# Patient Record
Sex: Female | Born: 1975
Health system: Southern US, Community
[De-identification: ages and names within clinical notes are randomized; demographics above are authoritative.]

## PROBLEM LIST (undated history)

## (undated) DIAGNOSIS — R51 Headache: Secondary | ICD-10-CM

## (undated) DIAGNOSIS — E039 Hypothyroidism, unspecified: Secondary | ICD-10-CM

## (undated) HISTORY — PX: BREAST BIOPSY: SHX20

## (undated) HISTORY — PX: POLYPECTOMY: SHX149

## (undated) HISTORY — PX: DILATION AND CURETTAGE OF UTERUS: SHX78

---

## 2000-09-10 ENCOUNTER — Emergency Department (HOSPITAL_COMMUNITY): Admission: EM | Admit: 2000-09-10 | Discharge: 2000-09-10 | Payer: Self-pay | Admitting: Emergency Medicine

## 2001-02-04 ENCOUNTER — Other Ambulatory Visit: Admission: RE | Admit: 2001-02-04 | Discharge: 2001-02-04 | Payer: Self-pay | Admitting: *Deleted

## 2002-07-28 ENCOUNTER — Other Ambulatory Visit: Admission: RE | Admit: 2002-07-28 | Discharge: 2002-07-28 | Payer: Self-pay | Admitting: *Deleted

## 2003-10-18 ENCOUNTER — Other Ambulatory Visit: Admission: RE | Admit: 2003-10-18 | Discharge: 2003-10-18 | Payer: Self-pay | Admitting: Gynecology

## 2004-02-22 ENCOUNTER — Ambulatory Visit (HOSPITAL_BASED_OUTPATIENT_CLINIC_OR_DEPARTMENT_OTHER): Admission: RE | Admit: 2004-02-22 | Discharge: 2004-02-22 | Payer: Self-pay | Admitting: Gynecology

## 2004-02-22 ENCOUNTER — Ambulatory Visit (HOSPITAL_COMMUNITY): Admission: RE | Admit: 2004-02-22 | Discharge: 2004-02-22 | Payer: Self-pay | Admitting: Gynecology

## 2004-02-22 ENCOUNTER — Encounter (INDEPENDENT_AMBULATORY_CARE_PROVIDER_SITE_OTHER): Payer: Self-pay | Admitting: Specialist

## 2004-11-26 ENCOUNTER — Ambulatory Visit (HOSPITAL_COMMUNITY): Admission: RE | Admit: 2004-11-26 | Discharge: 2004-11-26 | Payer: Self-pay | Admitting: Family Medicine

## 2004-12-16 ENCOUNTER — Other Ambulatory Visit: Admission: RE | Admit: 2004-12-16 | Discharge: 2004-12-16 | Payer: Self-pay | Admitting: Gynecology

## 2005-07-15 ENCOUNTER — Encounter: Admission: RE | Admit: 2005-07-15 | Discharge: 2005-07-15 | Payer: Self-pay | Admitting: Surgery

## 2005-07-15 ENCOUNTER — Encounter (INDEPENDENT_AMBULATORY_CARE_PROVIDER_SITE_OTHER): Payer: Self-pay | Admitting: Specialist

## 2005-12-23 ENCOUNTER — Other Ambulatory Visit: Admission: RE | Admit: 2005-12-23 | Discharge: 2005-12-23 | Payer: Self-pay | Admitting: Gynecology

## 2006-10-29 ENCOUNTER — Ambulatory Visit: Payer: Self-pay | Admitting: Cardiovascular Disease

## 2006-11-11 ENCOUNTER — Encounter: Payer: Self-pay | Admitting: Cardiology

## 2006-11-11 ENCOUNTER — Ambulatory Visit: Payer: Self-pay

## 2007-01-12 ENCOUNTER — Other Ambulatory Visit: Admission: RE | Admit: 2007-01-12 | Discharge: 2007-01-12 | Payer: Self-pay | Admitting: Gynecology

## 2007-09-02 ENCOUNTER — Ambulatory Visit: Payer: Self-pay | Admitting: Gastroenterology

## 2007-11-05 DIAGNOSIS — E039 Hypothyroidism, unspecified: Secondary | ICD-10-CM | POA: Insufficient documentation

## 2007-11-05 DIAGNOSIS — I499 Cardiac arrhythmia, unspecified: Secondary | ICD-10-CM | POA: Insufficient documentation

## 2007-11-05 DIAGNOSIS — R197 Diarrhea, unspecified: Secondary | ICD-10-CM | POA: Insufficient documentation

## 2007-11-05 DIAGNOSIS — E739 Lactose intolerance, unspecified: Secondary | ICD-10-CM | POA: Insufficient documentation

## 2008-01-16 ENCOUNTER — Other Ambulatory Visit: Admission: RE | Admit: 2008-01-16 | Discharge: 2008-01-16 | Payer: Self-pay | Admitting: Gynecology

## 2009-01-25 ENCOUNTER — Ambulatory Visit: Payer: Self-pay | Admitting: Women's Health

## 2009-01-25 ENCOUNTER — Other Ambulatory Visit: Admission: RE | Admit: 2009-01-25 | Discharge: 2009-01-25 | Payer: Self-pay | Admitting: Gynecology

## 2009-01-25 ENCOUNTER — Encounter: Payer: Self-pay | Admitting: Women's Health

## 2010-02-05 ENCOUNTER — Ambulatory Visit: Payer: Self-pay | Admitting: Women's Health

## 2010-02-05 ENCOUNTER — Other Ambulatory Visit: Admission: RE | Admit: 2010-02-05 | Discharge: 2010-02-05 | Payer: Self-pay | Admitting: Gynecology

## 2010-04-11 ENCOUNTER — Ambulatory Visit: Payer: Self-pay | Admitting: Women's Health

## 2010-05-15 ENCOUNTER — Ambulatory Visit: Payer: Self-pay | Admitting: Women's Health

## 2010-05-20 ENCOUNTER — Ambulatory Visit: Payer: Self-pay | Admitting: Women's Health

## 2010-05-28 ENCOUNTER — Ambulatory Visit: Payer: Self-pay | Admitting: Women's Health

## 2010-06-09 ENCOUNTER — Ambulatory Visit: Payer: Self-pay | Admitting: Women's Health

## 2010-06-19 ENCOUNTER — Ambulatory Visit: Payer: Self-pay | Admitting: Women's Health

## 2010-06-20 ENCOUNTER — Ambulatory Visit (HOSPITAL_BASED_OUTPATIENT_CLINIC_OR_DEPARTMENT_OTHER): Admission: RE | Admit: 2010-06-20 | Discharge: 2010-06-20 | Payer: Self-pay | Admitting: Gynecology

## 2010-06-20 ENCOUNTER — Ambulatory Visit: Payer: Self-pay | Admitting: Gynecology

## 2010-07-01 ENCOUNTER — Ambulatory Visit: Payer: Self-pay | Admitting: Gynecology

## 2010-07-09 ENCOUNTER — Ambulatory Visit: Payer: Self-pay | Admitting: Gynecology

## 2010-11-04 ENCOUNTER — Ambulatory Visit
Admission: RE | Admit: 2010-11-04 | Discharge: 2010-11-04 | Payer: Self-pay | Source: Home / Self Care | Attending: Gynecology | Admitting: Gynecology

## 2010-12-25 LAB — POCT HEMOGLOBIN-HEMACUE: Hemoglobin: 15.4 g/dL — ABNORMAL HIGH (ref 12.0–15.0)

## 2011-01-02 ENCOUNTER — Ambulatory Visit (INDEPENDENT_AMBULATORY_CARE_PROVIDER_SITE_OTHER): Payer: 59 | Admitting: Gynecology

## 2011-01-02 ENCOUNTER — Other Ambulatory Visit: Payer: 59

## 2011-01-02 DIAGNOSIS — O9989 Other specified diseases and conditions complicating pregnancy, childbirth and the puerperium: Secondary | ICD-10-CM

## 2011-01-02 DIAGNOSIS — N912 Amenorrhea, unspecified: Secondary | ICD-10-CM

## 2011-01-09 ENCOUNTER — Ambulatory Visit (INDEPENDENT_AMBULATORY_CARE_PROVIDER_SITE_OTHER): Payer: 59 | Admitting: Gynecology

## 2011-01-09 ENCOUNTER — Other Ambulatory Visit: Payer: 59

## 2011-01-09 DIAGNOSIS — O26849 Uterine size-date discrepancy, unspecified trimester: Secondary | ICD-10-CM

## 2011-01-09 DIAGNOSIS — N912 Amenorrhea, unspecified: Secondary | ICD-10-CM

## 2011-01-19 ENCOUNTER — Other Ambulatory Visit: Payer: 59

## 2011-01-19 ENCOUNTER — Ambulatory Visit (INDEPENDENT_AMBULATORY_CARE_PROVIDER_SITE_OTHER): Payer: 59 | Admitting: Gynecology

## 2011-01-19 DIAGNOSIS — O9989 Other specified diseases and conditions complicating pregnancy, childbirth and the puerperium: Secondary | ICD-10-CM

## 2011-01-19 DIAGNOSIS — N912 Amenorrhea, unspecified: Secondary | ICD-10-CM

## 2011-02-13 LAB — ABO/RH: RH Type: POSITIVE

## 2011-02-13 LAB — ANTIBODY SCREEN: Antibody Screen: NEGATIVE

## 2011-02-13 LAB — RPR: RPR: NONREACTIVE

## 2011-02-13 LAB — GC/CHLAMYDIA PROBE AMP, GENITAL
Chlamydia: NEGATIVE
Gonorrhea: NEGATIVE

## 2011-02-13 LAB — RUBELLA ANTIBODY, IGM: Rubella: IMMUNE

## 2011-02-13 LAB — HIV ANTIBODY (ROUTINE TESTING W REFLEX): HIV: NONREACTIVE

## 2011-02-13 LAB — HEPATITIS B SURFACE ANTIGEN: Hepatitis B Surface Ag: NEGATIVE

## 2011-02-23 ENCOUNTER — Other Ambulatory Visit (HOSPITAL_COMMUNITY): Payer: Self-pay | Admitting: Obstetrics and Gynecology

## 2011-02-23 DIAGNOSIS — Z3682 Encounter for antenatal screening for nuchal translucency: Secondary | ICD-10-CM

## 2011-02-24 ENCOUNTER — Other Ambulatory Visit (HOSPITAL_COMMUNITY): Payer: 59

## 2011-02-24 NOTE — Letter (Signed)
September 02, 2007    Holley Bouche, M.D.  [address]  [place], [place] [address]   RE:  [name], [name]  MRN:  [id number]  /  DOB:  [birth year]   Dear Dr. Tiburcio Pea:   Upon your kind referral, I had the pleasure of evaluating your patient  and I am pleased to offer my findings.  I saw Sharon Lloyd in the office  today.  Enclosed is a copy of my progress note that details my findings  and recommendations.   Thank you for the opportunity to participate in your patient's care.    Sincerely,      Barbette Hair. Arlyce Dice, MD,FACG  Electronically Signed    RDK/MedQ  DD: 09/02/2007  DT: 09/02/2007  Job #: [id number]

## 2011-02-24 NOTE — Letter (Signed)
September 02, 2007    Jac Canavan   RE:  MARIA, COIN  MRN:  [id number]  /  DOB:  [birth year]   Dear Georga Hacking:   It is my pleasure to have treated you recently as a new patient in my  office.  I appreciate your confidence and the opportunity to participate  in your care.   Since I do have a busy inpatient endoscopy schedule and office schedule,  my office hours vary weekly.  I am, however, available for emergency  calls every day through my office.  If I cannot promptly meet an urgent  office appointment, another one of our gastroenterologists will be able  to assist you.   My well-trained staff are prepared to help you at all times.  For  emergencies after office hours, a physician from our gastroenterology  section is always available through my 24-hour answering service.   While you are under my care, I encourage discussion of your questions  and concerns, and I will be happy to return your calls as soon as I am  available.   Once again, I welcome you as a new patient and I look forward to a happy  and healthy relationship.    Sincerely,      Barbette Hair. Arlyce Dice, MD,FACG  Electronically Signed   RDK/MedQ  DD: 09/02/2007  DT: 09/02/2007  Job #: [id number]

## 2011-02-24 NOTE — Assessment & Plan Note (Signed)
[facility]                         GASTROENTEROLOGY OFFICE NOTE   Sharon Lloyd, Sharon Lloyd                           MRN:          [id number]  DATE:09/02/2007                            DOB:          04/08/1976    REASON FOR CONSULTATION:  Upset stomach.   Sharon Lloyd is a pleasant 35 year old white female referred through the  courtesy of Dr. Tiburcio Pea for evaluation.  For years she has had a very  sensitive stomach characterized by post prandial gas, bloating and  diarrhea.  This absolutely occurs when she takes any dairy products.  She now finds that red meat and various foods are causing problems as  well.  There is no history of melena or hematochezia, weight has been  stable.   PAST MEDICAL HISTORY:  Pertinent for hypothyroidism.   FAMILY HISTORY:  Noncontributory.   MEDICATIONS:  An oral contraceptive and Levoxyl.   SHE HAS NO ALLERGIES.   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   REVIEW OF SYSTEMS:  Negative.   PHYSICAL EXAMINATION:  She is a healthy-appearing female.  Pulse 78,  blood pressure 108/70, weight 110.  HEENT: EOMI.  PERRLA.  Sclerae are anicteric.  Conjunctivae are pink.  NECK:  Supple without thyromegaly, adenopathy or carotid bruits.  CHEST:  Clear to auscultation and percussion without adventitious  sounds.  CARDIAC:  Regular rhythm; normal S1 S2.  There are no murmurs, gallops  or rubs.  ABDOMEN:  Bowel sounds are normoactive.  Abdomen is soft, nontender and  nondistended.  There are no abdominal masses, tenderness, splenic  enlargement or hepatomegaly.  EXTREMITIES:  Full range of motion.  No cyanosis, clubbing or edema.  RECTAL:  Deferred.   IMPRESSION:  1. Lactose intolerance.  2. Probable irritable bowel syndrome.   RECOMMENDATION:  1. Fiber supplementation.  2. Trial of complete lactose-free diet for 4-5 days.  At that point      she was instructed to      aggressively use Lactaid.  3. I will  re-evaluate Sharon Lloyd in approximately one month.     Barbette Hair. Arlyce Dice, MD,FACG  Electronically Signed    RDK/MedQ  DD: 09/02/2007  DT: 09/02/2007  Job #: [id number]   cc:   Holley Bouche, M.D.

## 2011-02-27 NOTE — Op Note (Signed)
Sharon Lloyd, Sharon Lloyd                             ACCOUNT NO.:  [id number]   MEDICAL RECORD NO.:  [id number]                   PATIENT TYPE:  AMB   LOCATION:  [facility]                                 FACILITY:  [facility]   PHYSICIAN:  Ivor Costa. Farrel Gobble, M.D.              DATE OF BIRTH:  11/21/1975   DATE OF PROCEDURE:  02/22/2004  DATE OF DISCHARGE:                                 OPERATIVE REPORT   PREOPERATIVE DIAGNOSES:  1. Endometrial polyp.  2. Dysmenorrhea.   POSTOPERATIVE DIAGNOSES:  1. Endometrial polyp.  2. Dysmenorrhea.   PROCEDURE:  D&C hysteroscopy.   SURGEON:  Ivor Costa. Farrel Gobble, M.D.   ANESTHESIA:  MAC with paracervical block.   ESTIMATED BLOOD LOSS:  Minimal.   I&O DEFICIT:  3% sorbitol solution with 65 mL.   FINDINGS:  A retroverted uterus, there was a large polyp protruding into the  upper cervical canal.  The cavity had smooth contours, the ostia were  visualized and normal bilaterally. Pathology was endometrial curettings and  the endometrial polyp.   DESCRIPTION OF PROCEDURE:  The patient was taken to the operating room, IV  sedation was then used and placed in the dorsal lithotomy position, prepped  and draped in the usual sterile fashion after Laminaria removal.  Her cervix  was noted to be grossly dilated up to 31 [place] prior to the beginning of  the procedure.  Dilute Pitressin solution was injected at 3 and 9 o'clock  after which 0.5% Marcaine solution was also injected.  The cervix was  stabilized with a single tooth tenaculum, the operative hysteroscope was  then advanced through the cervix and immediately upon entering the cavity a  necrotic appearing endometrial polyp was visualized protruding into the  upper cervical os.  We were ultimately able to negotiate the hysteroscope  beyond this polyp and it appeared to be coming off the anterior wall but  visualization was certainly obscured. Because of the size of the polyp, we  ended up removing this.  We  attempted to remove this with polyp forceps but  was unsuccessful; however, we were able to achieve a polypectomy with a  sharp curettage of the anterior wall. The hysteroscope was then advanced  back through the cavity. The ostia were able to be visualized. There was  some irregularity of the anterior wall and this was excised with  electrocautery and hemostatic afterwards.  A small defect on the posterior  wall was similarly  excised. The hysteroscope was then slowly removed from the cavity,  hemostasis throughout was assured. The cervical canal was noted to be  unremarkable. The patient tolerated the procedure well. Sponge and sharp  needle counts were correct x2 and she was transferred to the PACU in stable  condition.  Ivor Costa. Farrel Gobble, M.D.    THL/MEDQ  D:  02/22/2004  T:  02/22/2004  Job:  [id number]

## 2011-02-27 NOTE — H&P (Signed)
NAMELORIANNE, MALBROUGH NO.:  [id number]   MEDICAL RECORD NO.:  [id number]                  PATIENT TYPE:  JWLS   LOCATION:  [facility]                                 FACILITY:  [facility]   PHYSICIAN:  Ivor Costa. Farrel Gobble, M.D.              DATE OF BIRTH:  08-09-1976   DATE OF ADMISSION:  02/22/2004  DATE OF DISCHARGE:                                HISTORY & PHYSICAL   CHIEF COMPLAINT:  Endometrial polyp.   HISTORY OF PRESENT ILLNESS:  Patient is a 35 year old G 0 who had presented  for an annual examination, complaining of increasingly irregular periods  with polymenorrhea.  The patient states that she could flow for a total of 7-  8 days and spot in between.  She was felt on her exam to have an abnormal-  feeling uterus, which prompted an ultrasound, which showed the presence of  an echogenic focus in the cavity.  A sonohisterogram done shortly thereafter  confirmed the presence of an anterior wall defect on the right that was 13 x  8 x 12 mm and an irregular-shaped endometrial cavity.  The patient also  reports a history of dysmenorrhea.  She is currently contracepting with  less.  She states that her bleeding is better, but her cramping is worse.  She presents now for a D&C hysteroscopy.   PAST OB/GYN HISTORY:  Significant for history of Chlamydia diagnosed in  January, 2005.  The patient has had a negative test of cure.  She is also  negative RPR, HIV, and hepatitis.  Her cycles are as mentioned above.   PAST MEDICAL HISTORY:  Negative.   PAST SURGICAL HISTORY:  The patient had a urethral dilation in 1980.   MEDICATIONS:  She is on Alesse.   ALLERGIES:  Negative.   SOCIAL HISTORY:  Alcohol socially.  Caffeine in excess of 3 times a week.   FAMILY HISTORY:  Noncontributory.   PHYSICAL EXAMINATION:  GENERAL:  She is well-appearing in no acute distress.  LUNGS:  Clear to auscultation.  HEART:  Regular rate.  ABDOMEN:  Soft and nontender.  PELVIC:  On  speculum exam, her cervix deviates towards the right.  On  bimanual exam, her uterus is markedly retroverted.  Curettes were flexed,  sounding 8.  The patient had Laminaria placed the day before surgery.  EXTREMITIES:  Negative.   ASSESSMENT:  Endometrial polyp.  As mentioned, the patient had Laminaria  placed the day before.  She will present in the afternoon for a dilatation  and curettage/hysteroscopy with a polypectomy.  All questions were  addressed.  She was given a prescription preoperatively for Darvocet-N 100.  Ivor Costa. Farrel Gobble, M.D.    THL/MEDQ  D:  02/21/2004  T:  02/21/2004  Job:  [id number]

## 2011-02-27 NOTE — Assessment & Plan Note (Signed)
[facility]                            CARDIOLOGY OFFICE NOTE   Sharon Lloyd, Sharon Lloyd                           MRN:          [id number]  DATE:10/29/2006                            DOB:          [birth year]    REFERRING PHYSICIAN:  Holley Bouche, M.D.   Sharon Lloyd is a 35 year old patient referred by Dr. Tiburcio Pea for palpitations.  The Wednesday before Christmas she had rapid irregular heart beats, they  were not sustained, they were irregular. She has no significant chest  pain, shortness of breath, PND or orthopnea with them. She has not had  any syncope, she has never had previous heart arrhythmias. The irregular  heart beats lasted for about 2 weeks. During this period of time, she  was a bit sleep deprived working multiple 12 hours shifts as a shift.  There was also some stress during the holiday season. They lasted for  about 2 weeks and have subsided.   EKGs from Dr. Tiburcio Pea' office were essentially normal with sinus rhythm  and only isolated PVCs.   The patient does have a history of hypothyroidism since a very young  age. She has been on Synthroid replacement. Apparently her T4 level has  been a little high and her TSH has been normal. She will workup with Dr.  Tiburcio Pea for this.   REVIEW OF SYSTEMS:  Otherwise remarkable for no significant chest pain,  no PND or orthopnea, no other signs of hyperthyroidism.   She has not had a previous cardiac workup before. She quit smoking and  only did it socially in college.   She drinks on rare occasion.   She has 2 caffeinated beverages a day.   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 She is otherwise fairly active.  She travels quite a bit and likes to ski. She has had previous polyps  removed from her uterus in 2006. Surgical history otherwise negative.   She has no significant allergies. She is on birth control pills and  Levoxyl 100 alternating with 112 mcg.   Family History:  remarkable for  hypothyroidism on mothers side   PHYSICAL EXAMINATION:  HEENT:  Normal. There is no thyromegaly, there is  no lymphadenopathy.  LUNGS:  Clear.  HEART:  There is an S1, S2 with normal heart sounds.  ABDOMEN:  Benign.  EXTREMITIES:  Lower extremities intact pulses, no edema.  Neuro: non-focal  Skin:  warm and dry   EKG is essentially normal. She has prominent voltage but I think this is  because she is thin.   IMPRESSION:  Benign palpitations in the setting of stress and sleep  deprivation, occasional benign PVCs caught on an EKG. Symptoms have  resolved. At this time, I encouraged her to followup with Dr. Tiburcio Pea to  titrate her Synthroid replacement possibly to a slightly lower level.   She will try to avoid caffeine and will do a 2-D echocardiogram to rule  out any occult structural heart disease. She will call me if she has  recurrent symptoms in regards to getting hooked up to a  [employer]  monitor. As long as her echo is normal and her symptoms have not  returned, we will see her on a p.r.n. basis.     Sharon Lloyd. Eden Emms, MD, [facility]  Electronically Signed    PCN/MedQ  DD: 10/29/2006  DT: 10/29/2006  Job #: [id number]   cc:   Holley Bouche, M.D.

## 2011-03-02 ENCOUNTER — Encounter (HOSPITAL_COMMUNITY): Payer: Self-pay

## 2011-03-02 ENCOUNTER — Ambulatory Visit (HOSPITAL_COMMUNITY)
Admission: RE | Admit: 2011-03-02 | Discharge: 2011-03-02 | Disposition: A | Discharge status: Home / Self Care | Payer: 59 | Source: Ambulatory Visit | Attending: Obstetrics and Gynecology | Admitting: Obstetrics and Gynecology

## 2011-03-02 DIAGNOSIS — Z3682 Encounter for antenatal screening for nuchal translucency: Secondary | ICD-10-CM

## 2011-03-02 DIAGNOSIS — O351XX0 Maternal care for (suspected) chromosomal abnormality in fetus, not applicable or unspecified: Secondary | ICD-10-CM | POA: Insufficient documentation

## 2011-03-02 DIAGNOSIS — Z3689 Encounter for other specified antenatal screening: Secondary | ICD-10-CM | POA: Insufficient documentation

## 2011-03-02 DIAGNOSIS — O3510X0 Maternal care for (suspected) chromosomal abnormality in fetus, unspecified, not applicable or unspecified: Secondary | ICD-10-CM | POA: Insufficient documentation

## 2011-08-22 ENCOUNTER — Encounter (HOSPITAL_COMMUNITY): Payer: Self-pay | Admitting: Pharmacy Technician

## 2011-08-25 ENCOUNTER — Encounter (HOSPITAL_COMMUNITY): Payer: Self-pay

## 2011-08-25 NOTE — Patient Instructions (Signed)
   Your procedure is scheduled ZO:XWRUEAV November 20th  Enter through the Main Entrance of [facility] at:11am Pick up the phone at the desk and dial [phone] and inform us of your arrival.  Please call this number if you have any problems the morning of surgery: [phone]  Remember: Do not eat food after midnight:Monday Do not drink clear liquids after:Tuesday 8:30am Take these medicines the morning of surgery with a SIP OF WATER:  Do not wear jewelry, make-up, or FINGER nail polish Do not wear lotions, powders, or perfumes.  You may not  wear deodorant. Do not shave 48 hours prior to surgery. Do not bring valuables to the hospital.  Leave suitcase in the car. After Surgery it may be brought to your room. For patients being admitted to the hospital, checkout time is 11:00am the day of discharge.    Remember to use your hibiclens as instructed.Please shower with 1/2 bottle the evening before your surgery and the other 1/2 bottle the morning of surgery.

## 2011-08-28 ENCOUNTER — Encounter (HOSPITAL_COMMUNITY)
Admission: RE | Admit: 2011-08-28 | Discharge: 2011-08-28 | Disposition: A | Discharge status: Home / Self Care | Payer: 59 | Source: Ambulatory Visit | Attending: Obstetrics and Gynecology | Admitting: Obstetrics and Gynecology

## 2011-08-28 ENCOUNTER — Encounter (HOSPITAL_COMMUNITY): Payer: Self-pay

## 2011-08-28 HISTORY — DX: Hypothyroidism, unspecified: E03.9

## 2011-08-28 HISTORY — DX: Headache: R51

## 2011-08-28 LAB — SURGICAL PCR SCREEN
MRSA, PCR: NEGATIVE
Staphylococcus aureus: NEGATIVE

## 2011-08-28 LAB — CBC
HCT: 36.7 % (ref 36.0–46.0)
Hemoglobin: 12.9 g/dL (ref 12.0–15.0)
MCH: 31.8 pg (ref 26.0–34.0)
MCHC: 35.1 g/dL (ref 30.0–36.0)
MCV: 90.4 fL (ref 78.0–100.0)
Platelets: 230 10*3/uL (ref 150–400)
RBC: 4.06 MIL/uL (ref 3.87–5.11)
RDW: 13.1 % (ref 11.5–15.5)
WBC: 11 10*3/uL — ABNORMAL HIGH (ref 4.0–10.5)

## 2011-08-28 LAB — RPR: RPR Ser Ql: NONREACTIVE

## 2011-08-31 NOTE — H&P (Signed)
Sharon Lloyd NO.:  [id number]  MEDICAL RECORD NO.:  [id number]  LOCATION:                                 FACILITY:  PHYSICIAN:  Malachi Pro. Ambrose Mantle, M.D. DATE OF BIRTH:  1976/08/09  DATE OF ADMISSION:  09/01/2011 DATE OF DISCHARGE:                             HISTORY & PHYSICAL   PRESENT ILLNESS:  This is a 35 year old white female, para 0-0-1-0, gravida 2, last menstrual period October 29, 2010, with an [facility] of September 08, 2011.  An ultrasound at 6 weeks and 6 days on January 19, 2011.  On an ultrasound in our office on February 05, 2011, showed a 9-week 4-day pregnancy with an [facility] of September 06, 2011.  Blood group and type is AB positive with a negative antibody.  Pap smear normal.  Rubella immune.  RPR nonreactive.  Urine culture negative.  Hepatitis B surface antigen negative.  HIV negative.  TSH was 2.18.  GC and Chlamydia negative.  One-hour Glucola was 70.  Group B strep was negative.  Repeat HIV and RPR were nonreactive.  The patient's prenatal course was relatively uncomplicated.  Her ultrasound on April 13, 2011, showed an average gestational age of [redacted] weeks and 6 days with an [facility] of September 01, 2011.  Anatomy was normal. On August 11, 2011, the patient had a group B strep culture done and Dr. Jackelyn Knife felt the baby was in breech presentation.  It has remained in breech presentation since that time.  She is scheduled for C-section now.  She was to have a C-section done by Dr. Ellyn Hack, but Dr. Ellyn Hack is out on maternity leave.  Dr. Jackelyn Knife was to do it after that, but he has had a family emergency and asked me to do the C-section in his absence.  The patient understands that and agrees to proceed.  PAST MEDICAL HISTORY:  No known allergies, although ZITHROMAX causes upset stomach.  MEDICATIONS:  Prenatal vitamins and Levoxyl oral tablet 112 mcg a day.  SURGICAL HISTORY:  Breast biopsy, cyst removed from her uterus, dilatation and curettage,  and wisdom teeth extraction.  MEDICAL HISTORY:  She did receive blood transfusion at birth, none since.  She has acquired hypothyroidism and is followed by Dr. Talmage Nap. She has migraines with aura.  She has had a spontaneous miscarriage.  FAMILY HISTORY:  Her father has high blood pressure, prostate cancer and her mother has thyroid disease.  OBSTETRIC HISTORY:  The patient had a spontaneous abortion on June 20, 2010.  She is married.  She does not have a formal exercise program. She denies alcohol, tobacco, and drugs.  PHYSICAL EXAMINATION:  GENERAL:  On admission, well-developed, well- nourished white female in no distress. VITAL SIGNS:  Blood pressure is 118/66, pulse is 72, weight is 141 pounds. HEAD, EYES, NOSE, AND THROAT:  Normal. LUNGS:  Clear to auscultation. HEART:  Normal size and sounds.  No murmurs. ABDOMEN:  Soft, nontender.  Fundal height is 37 cm by ultrasound.  The vertex is still in the patient's right upper quadrant.  The back comes around to the patient's left, so the baby is presenting LST.  Cervix is not  examined today, but on August 27, 2011, the cervix was 1 cm, 30%, and the breech was at a -3.  ADMITTING IMPRESSION:  Intrauterine pregnancy at 39 weeks with breech presentation.  The patient elects to go ahead with C-section.     Malachi Pro. Ambrose Mantle, M.D.     TFH/MEDQ  D:  08/31/2011  T:  08/31/2011  Job:  [id number]

## 2011-09-01 ENCOUNTER — Encounter (HOSPITAL_COMMUNITY): Payer: Self-pay | Admitting: Anesthesiology

## 2011-09-01 ENCOUNTER — Inpatient Hospital Stay (HOSPITAL_COMMUNITY): Payer: 59 | Admitting: Anesthesiology

## 2011-09-01 ENCOUNTER — Other Ambulatory Visit: Payer: Self-pay | Admitting: Obstetrics and Gynecology

## 2011-09-01 ENCOUNTER — Inpatient Hospital Stay (HOSPITAL_COMMUNITY)
Admission: AD | Admit: 2011-09-01 | Discharge: 2011-09-04 | DRG: 766 | Disposition: A | Discharge status: Home / Self Care | Payer: 59 | Source: Ambulatory Visit | Attending: Obstetrics and Gynecology | Admitting: Obstetrics and Gynecology

## 2011-09-01 ENCOUNTER — Encounter (HOSPITAL_COMMUNITY)
Admission: AD | Disposition: A | Discharge status: Home / Self Care | Payer: Self-pay | Source: Ambulatory Visit | Attending: Obstetrics and Gynecology

## 2011-09-01 ENCOUNTER — Encounter (HOSPITAL_COMMUNITY): Payer: Self-pay | Admitting: *Deleted

## 2011-09-01 DIAGNOSIS — O321XX0 Maternal care for breech presentation, not applicable or unspecified: Principal | ICD-10-CM | POA: Diagnosis present

## 2011-09-01 DIAGNOSIS — Z98891 History of uterine scar from previous surgery: Secondary | ICD-10-CM | POA: Diagnosis not present

## 2011-09-01 SURGERY — Surgical Case
Anesthesia: Regional | Site: Uterus | Wound class: Clean Contaminated

## 2011-09-01 MED ORDER — DIPHENHYDRAMINE HCL 50 MG/ML IJ SOLN: 12.5000 mg | INTRAMUSCULAR | Status: DC | PRN

## 2011-09-01 MED ORDER — SCOPOLAMINE 1 MG/3DAYS TD PT72
MEDICATED_PATCH | TRANSDERMAL | Status: AC
  Administered 2011-09-01: 1.5 mg via TRANSDERMAL
  Filled 2011-09-01: qty 1

## 2011-09-01 MED ORDER — MORPHINE SULFATE 0.5 MG/ML IJ SOLN
INTRAMUSCULAR | Status: AC
  Filled 2011-09-01: qty 10

## 2011-09-01 MED ORDER — SCOPOLAMINE 1 MG/3DAYS TD PT72
1.0000 | MEDICATED_PATCH | Freq: Once | TRANSDERMAL | Status: DC
  Administered 2011-09-01: 1.5 mg via TRANSDERMAL

## 2011-09-01 MED ORDER — MEPERIDINE HCL 25 MG/ML IJ SOLN: 6.2500 mg | INTRAMUSCULAR | Status: DC | PRN

## 2011-09-01 MED ORDER — DIBUCAINE 1 % RE OINT: 1.0000 "application " | TOPICAL_OINTMENT | RECTAL | Status: DC | PRN

## 2011-09-01 MED ORDER — KETOROLAC TROMETHAMINE 30 MG/ML IJ SOLN
30.0000 mg | Freq: Four times a day (QID) | INTRAMUSCULAR | Status: DC | PRN
  Administered 2011-09-01 (×2): 30 mg via INTRAVENOUS
  Filled 2011-09-01: qty 1

## 2011-09-01 MED ORDER — ACETAMINOPHEN 325 MG PO TABS: 325.0000 mg | ORAL_TABLET | ORAL | Status: DC | PRN

## 2011-09-01 MED ORDER — OXYTOCIN 10 UNIT/ML IJ SOLN
INTRAMUSCULAR | Status: AC
  Filled 2011-09-01: qty 2

## 2011-09-01 MED ORDER — ONDANSETRON HCL 4 MG PO TABS: 4.0000 mg | ORAL_TABLET | ORAL | Status: DC | PRN

## 2011-09-01 MED ORDER — FENTANYL CITRATE 0.05 MG/ML IJ SOLN
INTRAMUSCULAR | Status: AC
  Filled 2011-09-01: qty 2

## 2011-09-01 MED ORDER — LACTATED RINGERS IV SOLN
Freq: Once | INTRAVENOUS | Status: AC
  Administered 2011-09-01 (×2): via INTRAVENOUS
  Administered 2011-09-01: 1000 mL/h via INTRAVENOUS
  Administered 2011-09-01 (×2): via INTRAVENOUS

## 2011-09-01 MED ORDER — LANOLIN HYDROUS EX OINT: 1.0000 "application " | TOPICAL_OINTMENT | CUTANEOUS | Status: DC | PRN

## 2011-09-01 MED ORDER — WITCH HAZEL-GLYCERIN EX PADS: 1.0000 "application " | MEDICATED_PAD | CUTANEOUS | Status: DC | PRN

## 2011-09-01 MED ORDER — DIPHENHYDRAMINE HCL 25 MG PO CAPS: 25.0000 mg | ORAL_CAPSULE | ORAL | Status: DC | PRN

## 2011-09-01 MED ORDER — TETANUS-DIPHTH-ACELL PERTUSSIS 5-2.5-18.5 LF-MCG/0.5 IM SUSP: 0.5000 mL | Freq: Once | INTRAMUSCULAR | Status: DC

## 2011-09-01 MED ORDER — IBUPROFEN 600 MG PO TABS
600.0000 mg | ORAL_TABLET | Freq: Four times a day (QID) | ORAL | Status: DC
  Administered 2011-09-02 – 2011-09-04 (×10): 600 mg via ORAL
  Filled 2011-09-01 (×5): qty 1

## 2011-09-01 MED ORDER — PHENYLEPHRINE 40 MCG/ML (10ML) SYRINGE FOR IV PUSH (FOR BLOOD PRESSURE SUPPORT)
PREFILLED_SYRINGE | INTRAVENOUS | Status: AC
  Filled 2011-09-01: qty 5

## 2011-09-01 MED ORDER — SODIUM CHLORIDE 0.9 % IV SOLN
1.0000 ug/kg/h | INTRAVENOUS | Status: DC | PRN
  Filled 2011-09-01: qty 2.5

## 2011-09-01 MED ORDER — CEFAZOLIN SODIUM 1-5 GM-% IV SOLN
1.0000 g | INTRAVENOUS | Status: AC
  Administered 2011-09-01: 1 g via INTRAVENOUS

## 2011-09-01 MED ORDER — PROMETHAZINE HCL 25 MG/ML IJ SOLN: 6.2500 mg | INTRAMUSCULAR | Status: DC | PRN

## 2011-09-01 MED ORDER — NALBUPHINE HCL 10 MG/ML IJ SOLN
5.0000 mg | INTRAMUSCULAR | Status: DC | PRN
Start: 2011-09-01 — End: 2011-09-04
  Filled 2011-09-01: qty 1

## 2011-09-01 MED ORDER — EPHEDRINE SULFATE 50 MG/ML IJ SOLN
INTRAMUSCULAR | Status: DC | PRN
  Administered 2011-09-01 (×3): 5 mg via INTRAVENOUS

## 2011-09-01 MED ORDER — DIPHENHYDRAMINE HCL 25 MG PO CAPS: 25.0000 mg | ORAL_CAPSULE | Freq: Four times a day (QID) | ORAL | Status: DC | PRN

## 2011-09-01 MED ORDER — OXYTOCIN 20 UNITS IN LACTATED RINGERS INFUSION - SIMPLE
INTRAVENOUS | Status: DC | PRN
  Administered 2011-09-01 (×2): 20 [IU] via INTRAVENOUS

## 2011-09-01 MED ORDER — ACETAMINOPHEN 10 MG/ML IV SOLN
1000.0000 mg | Freq: Four times a day (QID) | INTRAVENOUS | Status: AC | PRN
  Filled 2011-09-01: qty 100

## 2011-09-01 MED ORDER — SODIUM CHLORIDE 0.9 % IJ SOLN: 3.0000 mL | INTRAMUSCULAR | Status: DC | PRN

## 2011-09-01 MED ORDER — SENNOSIDES-DOCUSATE SODIUM 8.6-50 MG PO TABS
2.0000 | ORAL_TABLET | Freq: Every day | ORAL | Status: DC
  Administered 2011-09-02: 1 via ORAL
  Administered 2011-09-03: 2 via ORAL

## 2011-09-01 MED ORDER — KETOROLAC TROMETHAMINE 30 MG/ML IJ SOLN: 30.0000 mg | Freq: Four times a day (QID) | INTRAMUSCULAR | Status: DC | PRN

## 2011-09-01 MED ORDER — IBUPROFEN 600 MG PO TABS
600.0000 mg | ORAL_TABLET | Freq: Four times a day (QID) | ORAL | Status: DC | PRN
  Filled 2011-09-01 (×4): qty 1

## 2011-09-01 MED ORDER — NALOXONE HCL 0.4 MG/ML IJ SOLN: 0.4000 mg | INTRAMUSCULAR | Status: DC | PRN

## 2011-09-01 MED ORDER — ONDANSETRON HCL 4 MG/2ML IJ SOLN
INTRAMUSCULAR | Status: DC | PRN
  Administered 2011-09-01: 4 mg via INTRAVENOUS

## 2011-09-01 MED ORDER — OXYTOCIN 20 UNITS IN LACTATED RINGERS INFUSION - SIMPLE: 125.0000 mL/h | INTRAVENOUS | Status: AC

## 2011-09-01 MED ORDER — FENTANYL CITRATE 0.05 MG/ML IJ SOLN
INTRAMUSCULAR | Status: DC | PRN
  Administered 2011-09-01: 25 ug via INTRAVENOUS

## 2011-09-01 MED ORDER — NALBUPHINE HCL 10 MG/ML IJ SOLN
5.0000 mg | INTRAMUSCULAR | Status: DC | PRN
Start: 2011-09-01 — End: 2011-09-04
  Administered 2011-09-01: 5 mg via INTRAVENOUS
  Filled 2011-09-01: qty 1

## 2011-09-01 MED ORDER — ONDANSETRON HCL 4 MG/2ML IJ SOLN: 4.0000 mg | Freq: Three times a day (TID) | INTRAMUSCULAR | Status: DC | PRN

## 2011-09-01 MED ORDER — DIPHENHYDRAMINE HCL 50 MG/ML IJ SOLN: 25.0000 mg | INTRAMUSCULAR | Status: DC | PRN

## 2011-09-01 MED ORDER — EPHEDRINE 5 MG/ML INJ
INTRAVENOUS | Status: AC
  Filled 2011-09-01: qty 10

## 2011-09-01 MED ORDER — LACTATED RINGERS IV SOLN: INTRAVENOUS | Status: DC

## 2011-09-01 MED ORDER — FENTANYL CITRATE 0.05 MG/ML IJ SOLN
INTRAMUSCULAR | Status: AC
  Administered 2011-09-01: 50 ug via INTRAVENOUS
  Filled 2011-09-01: qty 2

## 2011-09-01 MED ORDER — SCOPOLAMINE 1 MG/3DAYS TD PT72
1.0000 | MEDICATED_PATCH | Freq: Once | TRANSDERMAL | Status: DC
  Filled 2011-09-01: qty 1

## 2011-09-01 MED ORDER — OXYCODONE-ACETAMINOPHEN 5-325 MG PO TABS
1.0000 | ORAL_TABLET | ORAL | Status: DC | PRN
  Administered 2011-09-02 (×3): 1 via ORAL
  Filled 2011-09-01 (×3): qty 1

## 2011-09-01 MED ORDER — ONDANSETRON HCL 4 MG/2ML IJ SOLN
INTRAMUSCULAR | Status: AC
  Filled 2011-09-01: qty 2

## 2011-09-01 MED ORDER — KETOROLAC TROMETHAMINE 30 MG/ML IJ SOLN
INTRAMUSCULAR | Status: AC
  Administered 2011-09-01: 30 mg via INTRAVENOUS
  Filled 2011-09-01: qty 1

## 2011-09-01 MED ORDER — METOCLOPRAMIDE HCL 5 MG/ML IJ SOLN: 10.0000 mg | Freq: Three times a day (TID) | INTRAMUSCULAR | Status: DC | PRN

## 2011-09-01 MED ORDER — FENTANYL CITRATE 0.05 MG/ML IJ SOLN
25.0000 ug | INTRAMUSCULAR | Status: DC | PRN
  Administered 2011-09-01: 50 ug via INTRAVENOUS

## 2011-09-01 MED ORDER — MEASLES, MUMPS & RUBELLA VAC ~~LOC~~ INJ
0.5000 mL | INJECTION | Freq: Once | SUBCUTANEOUS | Status: DC
  Filled 2011-09-01: qty 0.5

## 2011-09-01 MED ORDER — MENTHOL 3 MG MT LOZG: 1.0000 | LOZENGE | OROMUCOSAL | Status: DC | PRN

## 2011-09-01 MED ORDER — SIMETHICONE 80 MG PO CHEW
80.0000 mg | CHEWABLE_TABLET | Freq: Three times a day (TID) | ORAL | Status: DC
  Administered 2011-09-02 – 2011-09-04 (×10): 80 mg via ORAL

## 2011-09-01 MED ORDER — SIMETHICONE 80 MG PO CHEW: 80.0000 mg | CHEWABLE_TABLET | ORAL | Status: DC | PRN

## 2011-09-01 MED ORDER — PHENYLEPHRINE HCL 10 MG/ML IJ SOLN
INTRAMUSCULAR | Status: DC | PRN
  Administered 2011-09-01 (×5): 40 ug via INTRAVENOUS

## 2011-09-01 MED ORDER — ONDANSETRON HCL 4 MG/2ML IJ SOLN: 4.0000 mg | INTRAMUSCULAR | Status: DC | PRN

## 2011-09-01 MED ORDER — ZOLPIDEM TARTRATE 5 MG PO TABS: 5.0000 mg | ORAL_TABLET | Freq: Every evening | ORAL | Status: DC | PRN

## 2011-09-01 SURGICAL SUPPLY — 32 items
CLOTH BEACON ORANGE TIMEOUT ST (SAFETY) ×2 IMPLANT
CONTAINER PREFILL 10% NBF 15ML (MISCELLANEOUS) IMPLANT
DRESSING TELFA 8X3 (GAUZE/BANDAGES/DRESSINGS) IMPLANT
DRSG VASELINE 3X18 (GAUZE/BANDAGES/DRESSINGS) ×2 IMPLANT
ELECT REM PT RETURN 9FT ADLT (ELECTROSURGICAL) ×2
ELECTRODE REM PT RTRN 9FT ADLT (ELECTROSURGICAL) ×1 IMPLANT
EXTRACTOR VACUUM KIWI (MISCELLANEOUS) IMPLANT
EXTRACTOR VACUUM M CUP 4 TUBE (SUCTIONS) IMPLANT
GAUZE SPONGE 4X4 12PLY STRL LF (GAUZE/BANDAGES/DRESSINGS) ×3 IMPLANT
GAUZE VASELINE 3X9 (GAUZE/BANDAGES/DRESSINGS) ×1 IMPLANT
GLOVE BIO SURGEON STRL SZ7.5 (GLOVE) ×4 IMPLANT
GOWN PREVENTION PLUS LG XLONG (DISPOSABLE) ×4 IMPLANT
GOWN PREVENTION PLUS XLARGE (GOWN DISPOSABLE) ×2 IMPLANT
KIT ABG SYR 3ML LUER SLIP (SYRINGE) IMPLANT
NDL HYPO 25X5/8 SAFETYGLIDE (NEEDLE) ×1 IMPLANT
NEEDLE HYPO 25X5/8 SAFETYGLIDE (NEEDLE) ×2 IMPLANT
NS IRRIG 1000ML POUR BTL (IV SOLUTION) ×2 IMPLANT
PACK C SECTION WH (CUSTOM PROCEDURE TRAY) ×2 IMPLANT
PAD ABD 7.5X8 STRL (GAUZE/BANDAGES/DRESSINGS) IMPLANT
RTRCTR C-SECT PINK 25CM LRG (MISCELLANEOUS) ×1 IMPLANT
SLEEVE SCD COMPRESS KNEE MED (MISCELLANEOUS) IMPLANT
STAPLER VISISTAT 35W (STAPLE) ×1 IMPLANT
SUT PLAIN 0 NONE (SUTURE) IMPLANT
SUT VIC AB 0 CT1 36 (SUTURE) ×16 IMPLANT
SUT VIC AB 3-0 CTX 36 (SUTURE) ×2 IMPLANT
SUT VIC AB 3-0 SH 27 (SUTURE)
SUT VIC AB 3-0 SH 27X BRD (SUTURE) IMPLANT
SUT VIC AB 4-0 KS 27 (SUTURE) IMPLANT
SUT VICRYL 0 TIES 12 18 (SUTURE) IMPLANT
TOWEL OR 17X24 6PK STRL BLUE (TOWEL DISPOSABLE) ×4 IMPLANT
TRAY FOLEY CATH 14FR (SET/KITS/TRAYS/PACK) ×2 IMPLANT
WATER STERILE IRR 1000ML POUR (IV SOLUTION) ×2 IMPLANT

## 2011-09-01 NOTE — Transfer of Care (Signed)
Immediate Anesthesia Transfer of Care Note  Patient: Sharon Lloyd  Procedure(s) Performed:  CESAREAN SECTION  Patient Location: PACU  Anesthesia Type: Spinal  Level of Consciousness: awake, alert  and oriented  Airway & Oxygen Therapy: Patient Spontanous Breathing  Post-op Assessment: Report given to PACU RN and Post -op Vital signs reviewed and stable  Post vital signs: Reviewed and stable  Complications: No apparent anesthesia complications

## 2011-09-01 NOTE — Progress Notes (Signed)
Patient ID: Sharon Lloyd, female   DOB: January 07, 1976, 35 y.o.   MRN: [id number] I examined this pt 08-31-11 and there have been no changes in her health since then. US shows breech presentation and position is LST

## 2011-09-01 NOTE — Anesthesia Preprocedure Evaluation (Addendum)
Anesthesia Evaluation  Patient identified by MRN, date of birth, ID band Patient awake    Reviewed: Allergy & Precautions, H&P , Patient's Chart, lab work & pertinent test results  Airway Mallampati: II TM Distance: >3 FB Neck ROM: full    Dental No notable dental hx.    Pulmonary neg pulmonary ROS,  clear to auscultation  Pulmonary exam normal       Cardiovascular neg cardio ROS regular Normal    Neuro/Psych Negative Neurological ROS  Negative Psych ROS   GI/Hepatic negative GI ROS, Neg liver ROS,   Endo/Other  Negative Endocrine ROS  Renal/GU negative Renal ROS     Musculoskeletal   Abdominal   Peds  Hematology negative hematology ROS (+)   Anesthesia Other Findings   Reproductive/Obstetrics (+) Pregnancy                           Anesthesia Physical Anesthesia Plan  ASA: II  Anesthesia Plan: Spinal   Post-op Pain Management:    Induction:   Airway Management Planned:   Additional Equipment:   Intra-op Plan:   Post-operative Plan:   Informed Consent: I have reviewed the patients History and Physical, chart, labs and discussed the procedure including the risks, benefits and alternatives for the proposed anesthesia with the patient or authorized representative who has indicated his/her understanding and acceptance.     Plan Discussed with:   Anesthesia Plan Comments:         Anesthesia Quick Evaluation

## 2011-09-01 NOTE — Anesthesia Postprocedure Evaluation (Signed)
Anesthesia Post Note  Patient: Education officer, community  Procedure(s) Performed:  CESAREAN SECTION  Anesthesia type: Spinal  Patient location: PACU  Post pain: Pain level controlled  Post assessment: Post-op Vital signs reviewed  Last Vitals:  Filed Vitals:   09/01/11 1415  BP: 113/69  Pulse: 71  Temp:   Resp: 24    Post vital signs: Reviewed  Level of consciousness: awake  Complications: No apparent anesthesia complications

## 2011-09-01 NOTE — Anesthesia Procedure Notes (Addendum)
Spinal  Patient location during procedure: OR Start time: 09/01/2011 12:38 PM Staffing Performed by: anesthesiologist  Preanesthetic Checklist Completed: patient identified, site marked, surgical consent, pre-op evaluation, timeout performed, IV checked, risks and benefits discussed and monitors and equipment checked Spinal Block Patient position: sitting Prep: site prepped and draped and DuraPrep Patient monitoring: heart rate, cardiac monitor, continuous pulse ox and blood pressure Approach: midline Location: L3-4 Injection technique: single-shot Needle Needle type: Sprotte  Needle gauge: 24 G Needle length: 9 cm Assessment Sensory level: T4 Additional Notes Clear free flow CSF on first attempt.

## 2011-09-01 NOTE — Op Note (Signed)
NAMEJACHELLE, FLUTY NO.:  [id number]  MEDICAL RECORD NO.:  [id number]  LOCATION:  9131                          FACILITY:  [facility]  PHYSICIAN:  Malachi Pro. Ambrose Mantle, M.D. DATE OF BIRTH:  1976-01-13  DATE OF PROCEDURE:  09/01/2011 DATE OF DISCHARGE:                              OPERATIVE REPORT   PREOPERATIVE DIAGNOSIS:  Intrauterine pregnancy at 39 weeks, breech presentation.  POSTOPERATIVE DIAGNOSIS:  Intrauterine pregnancy at 39 weeks, breech presentation.  OPERATION:  Low-transverse cervical C-section.  OPERATOR:  Malachi Pro. Ambrose Mantle, MD  Spinal anesthesia, Dana Allan, MD.  The patient was brought to the operating room, given a spinal anesthetic by Dr. Rodman Pickle and placed supine on the table, tilted to the left.  Took a good while for the anesthetic to become effective, probably 10-15 minutes.  After it was effective, the abdomen was prepped with Betadine solution.  The urethra was prepped and a Foley catheter was inserted to straight drain.  The abdomen was then draped as a sterile field.  A time- out was done.  The husband was brought in to the room.  Anesthesia was confirmed and a transverse incision was made suprapubically and carried in layers through the skin, subcutaneous tissue, and fascia.  Fascia was separated from the rectus muscles superiorly and inferiorly.  The rectus muscle was already split somewhat in the midline.  This was continued until the rectus separation was close to the pubis.  Peritoneum was opened with sharp dissection.  Peritoneal incision was enlarged by stretching, and an Alexis retractor was placed into the abdominal cavity.  Lower uterine segment was exposed.  I made a short transverse incision through the superficial layers of the myometrium.  I went the rest away into the amniotic sac with my finger.  Clear fluid was obtained.  I enlarged the incision by pulling superiorly and inferiorly on the incision.  The breech was  delivered through the incisional opening.  The baby was a frank breech, pulled the baby out from the hips up through the thorax and the shoulders.  The head presented without any difficulty.  No fundal pressure was needed.  Nose and pharynx was suctioned with the bulb.  The cord was clamped, cut, and the baby was given to Dr. Roosvelt Harps, who was in attendance.  He assigned the 6 pounds 14 ounce female infant, Apgars of 9 at 1 and 9 at 5 minutes.  The placenta was removed intact.  The inside of the uterus was inspected and found to be free of any products of conception.  The cervix was dilated with a ring forceps.  The uterine incision was then closed in 2 layers using a running lock suture of 0 Vicryl on the first layer, nonlocking suture of the same material on the second layer.  A couple of interrupted figure-of-eight sutures of 3-0 Vicryl were used reapproximating the incision perfectly.  Both gutters were blotted free of blood.  Liberal irrigation confirmed hemostasis.  Both tubes and ovaries and the uterus were completely normal.  The Alexis retractor was removed and then the abdominal wall was closed using interrupted sutures of 0 Vicryl to incorporate the rectus  muscle and peritoneum, 2 running sutures of 0 Vicryl on the fascia, running 3-0 Vicryl on the subcu tissue, and staples on the skin.  The patient seemed to tolerate the procedure well. Blood loss was estimated at 500-600 mL.  Sponge and needle counts were correct.  She was returned to recovery in satisfactory condition.     Malachi Pro. Ambrose Mantle, M.D.     TFH/MEDQ  D:  09/01/2011  T:  09/01/2011  Job:  [id number]

## 2011-09-02 ENCOUNTER — Encounter (HOSPITAL_COMMUNITY)
Admission: AD | Disposition: A | Discharge status: Home / Self Care | Payer: Self-pay | Source: Ambulatory Visit | Attending: Obstetrics and Gynecology

## 2011-09-02 ENCOUNTER — Encounter (HOSPITAL_COMMUNITY): Payer: Self-pay | Admitting: Obstetrics and Gynecology

## 2011-09-02 LAB — CBC
HCT: 32.8 % — ABNORMAL LOW (ref 36.0–46.0)
Hemoglobin: 11.1 g/dL — ABNORMAL LOW (ref 12.0–15.0)
MCHC: 33.8 g/dL (ref 30.0–36.0)
MCV: 91.1 fL (ref 78.0–100.0)
RDW: 13.3 % (ref 11.5–15.5)

## 2011-09-02 SURGERY — Surgical Case
Anesthesia: Choice

## 2011-09-02 MED ORDER — PRENATAL PLUS 27-1 MG PO TABS
1.0000 | ORAL_TABLET | Freq: Every day | ORAL | Status: DC
  Administered 2011-09-04: 1 via ORAL
  Filled 2011-09-02 (×3): qty 1

## 2011-09-02 MED ORDER — LEVOTHYROXINE SODIUM 137 MCG PO TABS
137.0000 ug | ORAL_TABLET | Freq: Every day | ORAL | Status: DC
  Filled 2011-09-02: qty 1

## 2011-09-02 MED ORDER — LEVOTHYROXINE SODIUM 25 MCG PO TABS
ORAL_TABLET | Freq: Every day | ORAL | Status: DC
  Filled 2011-09-02 (×3): qty 1

## 2011-09-02 MED ORDER — CALCIUM CARBONATE ANTACID 500 MG PO CHEW
500.0000 mg | CHEWABLE_TABLET | Freq: Once | ORAL | Status: DC
  Filled 2011-09-02: qty 1

## 2011-09-02 MED ORDER — OXYCODONE-ACETAMINOPHEN 5-325 MG PO TABS
1.0000 | ORAL_TABLET | ORAL | Status: DC | PRN
  Administered 2011-09-02: 2 via ORAL
  Administered 2011-09-02 (×2): 1 via ORAL
  Administered 2011-09-03 – 2011-09-04 (×4): 2 via ORAL
  Filled 2011-09-02 (×2): qty 2
  Filled 2011-09-02: qty 1
  Filled 2011-09-02 (×3): qty 2
  Filled 2011-09-02: qty 1

## 2011-09-02 NOTE — Anesthesia Postprocedure Evaluation (Signed)
  Anesthesia Post-op Note  Patient: Education officer, community  Procedure(s) Performed:  CESAREAN SECTION  Patient Location: 131  Anesthesia Type: Epidural  Level of Consciousness: awake, alert  and oriented  Airway and Oxygen Therapy: Patient Spontanous Breathing  Post-op Pain: mild  Post-op Assessment: Post-op Vital signs reviewed, Patient's Cardiovascular Status Stable, No headache, No backache, No residual numbness and No residual motor weakness  Post-op Vital Signs: Reviewed and stable  Complications: No apparent anesthesia complications

## 2011-09-02 NOTE — Addendum Note (Signed)
Addendum  created 09/02/11 1010 by Karleen Dolphin   Modules edited:Notes Section

## 2011-09-02 NOTE — Progress Notes (Signed)
Patient ID: Sharon Lloyd, female   DOB: Jul 07, 1976, 35 y.o.   MRN: [id number] #1 afebrile BP normal HGB stable  12.9 to 11.1 Output excellent.

## 2011-09-03 NOTE — Progress Notes (Signed)
POD#2 Doing ok, struggling with breastfeeding Afeb, VSS Abd- soft, fundus firm, incision intact Continue routine care

## 2011-09-04 DIAGNOSIS — Z98891 History of uterine scar from previous surgery: Secondary | ICD-10-CM | POA: Diagnosis not present

## 2011-09-04 MED ORDER — OXYCODONE-ACETAMINOPHEN 5-325 MG PO TABS: 1.0000 | ORAL_TABLET | ORAL | Status: AC | PRN

## 2011-09-04 NOTE — Discharge Summary (Signed)
Obstetric Discharge Summary Reason for Admission: cesarean section Prenatal Procedures: none Intrapartum Procedures: cesarean: low cervical, transverse Postpartum Procedures: none Complications-Operative and Postpartum: none Hemoglobin  Date Value Range Status  09/02/2011 11.1* 12.0-15.0 (g/dL) Final     HCT  Date Value Range Status  09/02/2011 32.8* 36.0-46.0 (%) Final    Discharge Diagnoses: Term Pregnancy-delivered and breech presentation  Discharge Information: Date: 09/04/2011 Activity: pelvic rest and no strenuous activity Diet: routine Medications: Ibuprofen and Percocet Condition: stable Instructions: refer to practice specific booklet Discharge to: home Follow-up Information    Follow up with BOVARD,JODY, MD. Make an appointment in 2 weeks. (incision check)    Contact information:   [address] [facility] [address] [place] [address] [phone]          Newborn Data: Live born female  Birth Weight: 6 lb 14.8 oz (3140 g) APGAR: 9, 9  Home with mother.  Saria Haran D 09/04/2011, 10:00 AM

## 2011-09-04 NOTE — Progress Notes (Signed)
POD#3 Doing better Afeb, VSS Abd- soft, NT, fundus firm, incision intact D/c home

## 2013-06-06 ENCOUNTER — Encounter (HOSPITAL_COMMUNITY): Payer: Self-pay | Admitting: *Deleted

## 2013-06-06 ENCOUNTER — Emergency Department (HOSPITAL_COMMUNITY)
Admission: EM | Admit: 2013-06-06 | Discharge: 2013-06-06 | Disposition: A | Discharge status: Home / Self Care | Payer: 59 | Source: Home / Self Care

## 2013-06-06 DIAGNOSIS — J029 Acute pharyngitis, unspecified: Secondary | ICD-10-CM

## 2013-06-06 MED ORDER — AMOXICILLIN 875 MG PO TABS: 875.0000 mg | ORAL_TABLET | Freq: Two times a day (BID) | ORAL | Status: DC

## 2013-06-06 NOTE — ED Provider Notes (Signed)
Sharon Lloyd is a 37 y.o. female who presents to Urgent Care today for sore throat starting yesterday. Patient also noted a mild fever and cough. She noted some exudate on the posterior pharynx himself diagnosed with strep throat and administered on amoxicillin. She also tried some Tylenol which has helped a bit. She denies any trouble breathing nausea vomiting or diarrhea. Her current symptoms are consistent with prior episodes of strep throat. She feels well otherwise.   PMH reviewed. Healthy otherwise History  Substance Use Topics  . Smoking status: Never Smoker   . Smokeless tobacco: Not on file  . Alcohol Use: No   ROS as above Medications reviewed. No current facility-administered medications for this encounter.   Current Outpatient Prescriptions  Medication Sig Dispense Refill  . amoxicillin (AMOXIL) 875 MG tablet Take 1 tablet (875 mg total) by mouth 2 (two) times daily.  20 tablet  0  . calcium carbonate (TUMS EX) 750 MG chewable tablet Chew 2 tablets by mouth once a week. PRN heartburn.       . levothyroxine (LEVOXYL) 137 MCG tablet Take 137 mcg by mouth daily.        . prenatal vitamin w/FE, FA (PRENATAL 1 + 1) 27-1 MG TABS Take 1 tablet by mouth daily.          Exam:  BP 133/95  Pulse 85  Temp(Src) 98.7 F (37.1 C) (Oral)  Resp 16  SpO2 100%  LMP 05/30/2013 Gen: Well NAD HEENT: EOMI,  MMM posterior pharynx is erythematous with exudate Lungs: CTABL Nl WOB Heart: RRR no MRG Abd: NABS, NT, ND Exts: Non edematous BL  LE, warm and well perfused.   Results for orders placed during the hospital encounter of 06/06/13 (from the past 24 hour(s))  POCT RAPID STREP A ([facility] ONLY)     Status: None   Collection Time    06/06/13  7:54 PM      Result Value Range   Streptococcus, Group A Screen (Direct) NEGATIVE  NEGATIVE   No results found.  Assessment and Plan: 37 y.o. female with likely viral pharyngitis.  Symptomatic management with Tylenol and ibuprofen.   Prescribe amoxicillin in case she's not improved in several days. It's possible herself administration of amoxicillin has reduced the strep bacteria present and made the test less sensitive. Discussed warning signs or symptoms. Please see discharge instructions. Patient expresses understanding.      Rodolph Bong, MD 06/06/13 2020

## 2013-06-06 NOTE — ED Notes (Signed)
Pt  Reports  Symptoms  Of  sorethroat  With  Pain  When  She  Swallows      Sitting  Upright on the  Exam table  And  She  Is  Speaking in  Complete   sentances

## 2013-06-08 LAB — CULTURE, GROUP A STREP

## 2013-11-02 ENCOUNTER — Encounter (HOSPITAL_COMMUNITY): Payer: Self-pay | Admitting: Emergency Medicine

## 2013-11-02 ENCOUNTER — Emergency Department (HOSPITAL_COMMUNITY)
Admission: EM | Admit: 2013-11-02 | Discharge: 2013-11-03 | Disposition: A | Discharge status: Home / Self Care | Payer: 59 | Attending: Emergency Medicine | Admitting: Emergency Medicine

## 2013-11-02 DIAGNOSIS — R519 Headache, unspecified: Secondary | ICD-10-CM

## 2013-11-02 DIAGNOSIS — B9789 Other viral agents as the cause of diseases classified elsewhere: Secondary | ICD-10-CM | POA: Insufficient documentation

## 2013-11-02 DIAGNOSIS — B349 Viral infection, unspecified: Secondary | ICD-10-CM

## 2013-11-02 DIAGNOSIS — Z79899 Other long term (current) drug therapy: Secondary | ICD-10-CM | POA: Insufficient documentation

## 2013-11-02 DIAGNOSIS — Z792 Long term (current) use of antibiotics: Secondary | ICD-10-CM | POA: Insufficient documentation

## 2013-11-02 DIAGNOSIS — R51 Headache: Secondary | ICD-10-CM

## 2013-11-02 DIAGNOSIS — Z8679 Personal history of other diseases of the circulatory system: Secondary | ICD-10-CM | POA: Insufficient documentation

## 2013-11-02 DIAGNOSIS — E039 Hypothyroidism, unspecified: Secondary | ICD-10-CM | POA: Insufficient documentation

## 2013-11-02 NOTE — ED Notes (Signed)
Pt states positive flu swab at urgent care today.  Has been taking tamiflu and states the flu symptoms are getting better, but she is having extreme headaches tonight with vomiting when the headache is bad.

## 2013-11-03 MED ORDER — METOCLOPRAMIDE HCL 5 MG/ML IJ SOLN
10.0000 mg | Freq: Once | INTRAMUSCULAR | Status: AC
  Administered 2013-11-03: 10 mg via INTRAVENOUS
  Filled 2013-11-03: qty 2

## 2013-11-03 MED ORDER — ALBUTEROL SULFATE HFA 108 (90 BASE) MCG/ACT IN AERS
1.0000 | INHALATION_SPRAY | RESPIRATORY_TRACT | Status: DC | PRN
  Administered 2013-11-03: 2 via RESPIRATORY_TRACT
  Filled 2013-11-03: qty 6.7

## 2013-11-03 MED ORDER — KETOROLAC TROMETHAMINE 30 MG/ML IJ SOLN
30.0000 mg | Freq: Once | INTRAMUSCULAR | Status: AC
  Administered 2013-11-03: 30 mg via INTRAVENOUS
  Filled 2013-11-03: qty 1

## 2013-11-03 MED ORDER — SODIUM CHLORIDE 0.9 % IV BOLUS (SEPSIS)
1000.0000 mL | Freq: Once | INTRAVENOUS | Status: AC
  Administered 2013-11-03: 1000 mL via INTRAVENOUS

## 2013-11-03 MED ORDER — DIPHENHYDRAMINE HCL 50 MG/ML IJ SOLN
25.0000 mg | Freq: Once | INTRAMUSCULAR | Status: AC
  Administered 2013-11-03: 25 mg via INTRAVENOUS
  Filled 2013-11-03: qty 1

## 2013-11-03 NOTE — ED Notes (Signed)
IV stopped due to the fact that the patient was c/o pain and it burning, no swelling noted.  All  meds given

## 2013-11-03 NOTE — ED Notes (Signed)
Discharge instructions given  Voiced understanding.   

## 2013-11-03 NOTE — Discharge Instructions (Signed)
Please follow up with your doctor for recheck in 2-3 days.  Rest.  Drink plenty of fluids.  Return to the ER for worsening condition or new concerning symptoms.   Viral Infections A viral infection can be caused by different types of viruses.Most viral infections are not serious and resolve on their own. However, some infections may cause severe symptoms and may lead to further complications. SYMPTOMS Viruses can frequently cause:  Minor sore throat.  Aches and pains.  Headaches.  Runny nose.  Different types of rashes.  Watery eyes.  Tiredness.  Cough.  Loss of appetite.  Gastrointestinal infections, resulting in nausea, vomiting, and diarrhea. These symptoms do not respond to antibiotics because the infection is not caused by bacteria. However, you might catch a bacterial infection following the viral infection. This is sometimes called a "superinfection." Symptoms of such a bacterial infection may include:  Worsening sore throat with pus and difficulty swallowing.  Swollen neck glands.  Chills and a high or persistent fever.  Severe headache.  Tenderness over the sinuses.  Persistent overall ill feeling (malaise), muscle aches, and tiredness (fatigue).  Persistent cough.  Yellow, green, or brown mucus production with coughing. HOME CARE INSTRUCTIONS   Only take over-the-counter or prescription medicines for pain, discomfort, diarrhea, or fever as directed by your caregiver.  Drink enough water and fluids to keep your urine clear or pale yellow. Sports drinks can provide valuable electrolytes, sugars, and hydration.  Get plenty of rest and maintain proper nutrition. Soups and broths with crackers or rice are fine. SEEK IMMEDIATE MEDICAL CARE IF:   You have severe headaches, shortness of breath, chest pain, neck pain, or an unusual rash.  You have uncontrolled vomiting, diarrhea, or you are unable to keep down fluids.  You or your child has an oral  temperature above 102 F (38.9 C), not controlled by medicine.  Your baby is older than 3 months with a rectal temperature of 102 F (38.9 C) or higher.  Your baby is 303 months old or younger with a rectal temperature of 100.4 F (38 C) or higher. MAKE SURE YOU:   Understand these instructions.  Will watch your condition.  Will get help right away if you are not doing well or get worse. Document Released: 07/08/2005 Document Revised: 12/21/2011 Document Reviewed: 02/02/2011 [facility] Patient Information 2014 [place].  Headaches, Frequently Asked Questions MIGRAINE HEADACHES Q: What is migraine? What causes it? How can I treat it? A: Generally, migraine headaches begin as a dull ache. Then they develop into a constant, throbbing, and pulsating pain. You may experience pain at the temples. You may experience pain at the front or back of one or both sides of the head. The pain is usually accompanied by a combination of:  Nausea.  Vomiting.  Sensitivity to light and noise. Some people (about 15%) experience an aura (see below) before an attack. The cause of migraine is believed to be chemical reactions in the brain. Treatment for migraine may include over-the-counter or prescription medications. It may also include self-help techniques. These include relaxation training and biofeedback.  Q: What is an aura? A: About 15% of people with migraine get an "aura". This is a sign of neurological symptoms that occur before a migraine headache. You may see wavy or jagged lines, dots, or flashing lights. You might experience tunnel vision or blind spots in one or both eyes. The aura can include visual or auditory hallucinations (something imagined). It may include disruptions in smell (such  as strange odors), taste or touch. Other symptoms include:  Numbness.  A "pins and needles" sensation.  Difficulty in recalling or speaking the correct word. These neurological events may last as long  as 60 minutes. These symptoms will fade as the headache begins. Q: What is a trigger? A: Certain physical or environmental factors can lead to or "trigger" a migraine. These include:  Foods.  Hormonal changes.  Weather.  Stress. It is important to remember that triggers are different for everyone. To help prevent migraine attacks, you need to figure out which triggers affect you. Keep a headache diary. This is a good way to track triggers. The diary will help you talk to your healthcare professional about your condition. Q: Does weather affect migraines? A: Bright sunshine, hot, humid conditions, and drastic changes in barometric pressure may lead to, or "trigger," a migraine attack in some people. But studies have shown that weather does not act as a trigger for everyone with migraines. Q: What is the link between migraine and hormones? A: Hormones start and regulate many of your body's functions. Hormones keep your body in balance within a constantly changing environment. The levels of hormones in your body are unbalanced at times. Examples are during menstruation, pregnancy, or menopause. That can lead to a migraine attack. In fact, about three quarters of all women with migraine report that their attacks are related to the menstrual cycle.  Q: Is there an increased risk of stroke for migraine sufferers? A: The likelihood of a migraine attack causing a stroke is very remote. That is not to say that migraine sufferers cannot have a stroke associated with their migraines. In persons under age 82, the most common associated factor for stroke is migraine headache. But over the course of a person's normal life span, the occurrence of migraine headache may actually be associated with a reduced risk of dying from cerebrovascular disease due to stroke.  Q: What are acute medications for migraine? A: Acute medications are used to treat the pain of the headache after it has started. Examples  over-the-counter medications, NSAIDs, ergots, and triptans.  Q: What are the triptans? A: Triptans are the newest class of abortive medications. They are specifically targeted to treat migraine. Triptans are vasoconstrictors. They moderate some chemical reactions in the brain. The triptans work on receptors in your brain. Triptans help to restore the balance of a neurotransmitter called serotonin. Fluctuations in levels of serotonin are thought to be a main cause of migraine.  Q: Are over-the-counter medications for migraine effective? A: Over-the-counter, or "OTC," medications may be effective in relieving mild to moderate pain and associated symptoms of migraine. But you should see your caregiver before beginning any treatment regimen for migraine.  Q: What are preventive medications for migraine? A: Preventive medications for migraine are sometimes referred to as "prophylactic" treatments. They are used to reduce the frequency, severity, and length of migraine attacks. Examples of preventive medications include antiepileptic medications, antidepressants, beta-blockers, calcium channel blockers, and NSAIDs (nonsteroidal anti-inflammatory drugs). Q: Why are anticonvulsants used to treat migraine? A: During the past few years, there has been an increased interest in antiepileptic drugs for the prevention of migraine. They are sometimes referred to as "anticonvulsants". Both epilepsy and migraine may be caused by similar reactions in the brain.  Q: Why are antidepressants used to treat migraine? A: Antidepressants are typically used to treat people with depression. They may reduce migraine frequency by regulating chemical levels, such as serotonin, in the brain.  Q: What alternative therapies are used to treat migraine? A: The term "alternative therapies" is often used to describe treatments considered outside the scope of conventional Western medicine. Examples of alternative therapy include acupuncture,  acupressure, and yoga. Another common alternative treatment is herbal therapy. Some herbs are believed to relieve headache pain. Always discuss alternative therapies with your caregiver before proceeding. Some herbal products contain arsenic and other toxins. TENSION HEADACHES Q: What is a tension-type headache? What causes it? How can I treat it? A: Tension-type headaches occur randomly. They are often the result of temporary stress, anxiety, fatigue, or anger. Symptoms include soreness in your temples, a tightening band-like sensation around your head (a "vice-like" ache). Symptoms can also include a pulling feeling, pressure sensations, and contracting head and neck muscles. The headache begins in your forehead, temples, or the back of your head and neck. Treatment for tension-type headache may include over-the-counter or prescription medications. Treatment may also include self-help techniques such as relaxation training and biofeedback. CLUSTER HEADACHES Q: What is a cluster headache? What causes it? How can I treat it? A: Cluster headache gets its name because the attacks come in groups. The pain arrives with little, if any, warning. It is usually on one side of the head. A tearing or bloodshot eye and a runny nose on the same side of the headache may also accompany the pain. Cluster headaches are believed to be caused by chemical reactions in the brain. They have been described as the most severe and intense of any headache type. Treatment for cluster headache includes prescription medication and oxygen. SINUS HEADACHES Q: What is a sinus headache? What causes it? How can I treat it? A: When a cavity in the bones of the face and skull (a sinus) becomes inflamed, the inflammation will cause localized pain. This condition is usually the result of an allergic reaction, a tumor, or an infection. If your headache is caused by a sinus blockage, such as an infection, you will probably have a fever. An x-ray  will confirm a sinus blockage. Your caregiver's treatment might include antibiotics for the infection, as well as antihistamines or decongestants.  REBOUND HEADACHES Q: What is a rebound headache? What causes it? How can I treat it? A: A pattern of taking acute headache medications too often can lead to a condition known as "rebound headache." A pattern of taking too much headache medication includes taking it more than 2 days per week or in excessive amounts. That means more than the label or a caregiver advises. With rebound headaches, your medications not only stop relieving pain, they actually begin to cause headaches. Doctors treat rebound headache by tapering the medication that is being overused. Sometimes your caregiver will gradually substitute a different type of treatment or medication. Stopping may be a challenge. Regularly overusing a medication increases the potential for serious side effects. Consult a caregiver if you regularly use headache medications more than 2 days per week or more than the label advises. ADDITIONAL QUESTIONS AND ANSWERS Q: What is biofeedback? A: Biofeedback is a self-help treatment. Biofeedback uses special equipment to monitor your body's involuntary physical responses. Biofeedback monitors:  Breathing.  Pulse.  Heart rate.  Temperature.  Muscle tension.  Brain activity. Biofeedback helps you refine and perfect your relaxation exercises. You learn to control the physical responses that are related to stress. Once the technique has been mastered, you do not need the equipment any more. Q: Are headaches hereditary? A: Four out of five (80%)  of people that suffer report a family history of migraine. Scientists are not sure if this is genetic or a family predisposition. Despite the uncertainty, a child has a 50% chance of having migraine if one parent suffers. The child has a 75% chance if both parents suffer.  Q: Can children get headaches? A: By the time  they reach high school, most young people have experienced some type of headache. Many safe and effective approaches or medications can prevent a headache from occurring or stop it after it has begun.  Q: What type of doctor should I see to diagnose and treat my headache? A: Start with your primary caregiver. Discuss his or her experience and approach to headaches. Discuss methods of classification, diagnosis, and treatment. Your caregiver may decide to recommend you to a headache specialist, depending upon your symptoms or other physical conditions. Having diabetes, allergies, etc., may require a more comprehensive and inclusive approach to your headache. The National Headache Foundation will provide, upon request, a list of [facility] physician members in your state. Document Released: 12/19/2003 Document Revised: 12/21/2011 Document Reviewed: 05/28/2008 [facility] Patient Information 2014 [place], [place].

## 2013-11-03 NOTE — ED Provider Notes (Signed)
CSN: [id number]     Arrival date & time 11/02/13  2220 History   First MD Initiated Contact with Patient 11/03/13 0024     Chief Complaint  Patient presents with  . Influenza   (Consider location/radiation/quality/duration/timing/severity/associated sxs/prior Treatment) HPI 38 year old female presents to emergency room with headache, nausea and vomiting.  Pt reports her husband was dx with flu on Sunday.  She began to have flu like symptoms on Monday/Tuesday, PCP called in tamiflu.  Pt was feeling worse earlier today with increased congestion and cough.  Pt was seen at urgent care, received breathing tx.  CXR neg, flu neg, given rx for amox, but not yet started.  Tonight began with right sided headache, severe in nature.  Pt has h/o migraines, but reports tonight's headache was different in location, quality.  HA worse with vomiting.  No fevers with vomiting and headache tonight.  Pt has not taken tamiflu in past.   Past Medical History  Diagnosis Date  . Hypothyroidism   . [id number](478.2)    Past Surgical History  Procedure Laterality Date  . Dilation and curettage of uterus    . Polypectomy    . Cesarean section  09/01/2011    Procedure: CESAREAN SECTION;  Surgeon: Bing Plume, MD;  Location: [facility];  Service: Gynecology;  Laterality: N/A;   No family history on file. History  Substance Use Topics  . Smoking status: Never Smoker   . Smokeless tobacco: Not on file  . Alcohol Use: No   OB History   Grav Para Term Preterm Abortions TAB SAB Ect Mult Living   2 1 1  1     1      Review of Systems  All other systems reviewed and are negative.    Allergies  Azithromycin  Home Medications   Current Outpatient Rx  Name  Route  Sig  Dispense  Refill  . acetaminophen (TYLENOL) 325 MG tablet   Oral   Take 650 mg by mouth every 6 (six) hours as needed for mild pain, moderate pain, fever or headache.         Marland Kitchen amoxicillin (AMOXIL) 500 MG capsule   Oral   Take 1,000 mg by  mouth 2 (two) times daily.         . chlorpheniramine-HYDROcodone (TUSSIONEX PENNKINETIC ER) 10-8 MG/5ML LQCR   Oral   Take 5 mLs by mouth 2 (two) times daily.         Marland Kitchen levothyroxine (LEVOXYL) 137 MCG tablet   Oral   Take 137 mcg by mouth daily.           . norethindrone-ethinyl estradiol (TRIPHASIL,CYCLAFEM,ALYACEN) 0.5/0.75/1-35 MG-MCG tablet   Oral   Take 1 tablet by mouth daily.         Marland Kitchen oseltamivir (TAMIFLU) 75 MG capsule   Oral   Take 75 mg by mouth 2 (two) times daily.          BP 111/68  Pulse 79  Temp(Src) 97.6 F (36.4 C) (Oral)  Resp 18  Ht 5\' 2"  (1.575 m)  Wt 106 lb (48.081 kg)  BMI 19.38 kg/m2  SpO2 97% Physical Exam  Nursing note and vitals reviewed. Constitutional: She is oriented to person, place, and time. She appears well-developed and well-nourished.  Uncomfortable appearing  HENT:  Head: Normocephalic and atraumatic.  Right Ear: External ear normal.  Left Ear: External ear normal.  Nose: Nose normal.  Mouth/Throat: Oropharynx is clear and moist.  Eyes: Conjunctivae and EOM are  normal. Pupils are equal, round, and reactive to light.  Neck: Normal range of motion. Neck supple. No JVD present. No tracheal deviation present. No thyromegaly present.  Cardiovascular: Normal rate, regular rhythm, normal heart sounds and intact distal pulses.  Exam reveals no gallop and no friction rub.   No murmur heard. Pulmonary/Chest: Effort normal and breath sounds normal. No stridor. No respiratory distress. She has no wheezes. She has no rales. She exhibits no tenderness.  Abdominal: Soft. Bowel sounds are normal. She exhibits no distension and no mass. There is no tenderness. There is no rebound and no guarding.  Musculoskeletal: Normal range of motion. She exhibits no edema and no tenderness.  Lymphadenopathy:    She has no cervical adenopathy.  Neurological: She is alert and oriented to person, place, and time. She has normal reflexes. No cranial nerve  deficit. She exhibits normal muscle tone. Coordination normal.  Skin: Skin is warm and dry. No rash noted. No erythema. No pallor.  Psychiatric: She has a normal mood and affect. Her behavior is normal. Judgment and thought content normal.    ED Course  Procedures (including critical care time) Labs Review Labs Reviewed - No data to display Imaging Review No results found.  EKG Interpretation   None       MDM   1. Headache   2. Viral syndrome    38 yo female with headache.  May be secondary to tamiflu (per information, up to 18% of patients will have HA), or may be atypical migraine triggered by current illness.  Pt feeling better after iv fluids, medications.    Olivia Mackielga M Raela Bohl, MD 11/03/13 0530

## 2014-08-13 ENCOUNTER — Encounter (HOSPITAL_COMMUNITY): Payer: Self-pay | Admitting: Emergency Medicine

## 2015-10-16 MED FILL — LEVOTHYROXINE 137 MCG TAB: 137 | 28 days supply | Qty: 20 | Fill #5

## 2015-10-16 MED FILL — LEVOTHYROXINE 150 MCG TAB: 150 | 28 days supply | Qty: 8 | Fill #5

## 2015-11-01 MED FILL — SUMATRIPTAN SUCC 50 MG TAB: 50 | 30 days supply | Qty: 9 | Fill #2

## 2015-12-02 MED FILL — LEVOTHYROXINE 150 MCG TAB: 150 | 30 days supply | Qty: 30 | Fill #0

## 2016-01-08 MED FILL — LEVOTHYROXINE 150 MCG TAB: 150 | 30 days supply | Qty: 30 | Fill #1

## 2016-02-28 MED FILL — SUMATRIPTAN SUCC 50 MG TAB: 50 | 30 days supply | Qty: 9 | Fill #3

## 2016-02-28 MED FILL — LEVOTHYROXINE 150 MCG TAB: 150 | 30 days supply | Qty: 30 | Fill #2

## 2016-04-06 MED FILL — LEVOTHYROXINE 150 MCG TAB: 150 | 30 days supply | Qty: 30 | Fill #0

## 2016-11-02 DIAGNOSIS — E039 Hypothyroidism, unspecified: Secondary | ICD-10-CM | POA: Diagnosis not present

## 2016-11-17 DIAGNOSIS — E039 Hypothyroidism, unspecified: Secondary | ICD-10-CM | POA: Diagnosis not present

## 2016-11-27 DIAGNOSIS — L259 Unspecified contact dermatitis, unspecified cause: Secondary | ICD-10-CM | POA: Diagnosis not present

## 2016-11-27 DIAGNOSIS — L709 Acne, unspecified: Secondary | ICD-10-CM | POA: Diagnosis not present

## 2017-06-07 DIAGNOSIS — J029 Acute pharyngitis, unspecified: Secondary | ICD-10-CM | POA: Diagnosis not present

## 2017-09-28 DIAGNOSIS — N889 Noninflammatory disorder of cervix uteri, unspecified: Secondary | ICD-10-CM | POA: Diagnosis not present

## 2017-09-28 DIAGNOSIS — N72 Inflammatory disease of cervix uteri: Secondary | ICD-10-CM | POA: Diagnosis not present

## 2017-09-28 DIAGNOSIS — Z01419 Encounter for gynecological examination (general) (routine) without abnormal findings: Secondary | ICD-10-CM | POA: Diagnosis not present

## 2017-09-28 DIAGNOSIS — Z124 Encounter for screening for malignant neoplasm of cervix: Secondary | ICD-10-CM | POA: Diagnosis not present

## 2017-11-09 DIAGNOSIS — J069 Acute upper respiratory infection, unspecified: Secondary | ICD-10-CM | POA: Diagnosis not present

## 2017-11-17 DIAGNOSIS — E039 Hypothyroidism, unspecified: Secondary | ICD-10-CM | POA: Diagnosis not present

## 2018-06-20 DIAGNOSIS — J309 Allergic rhinitis, unspecified: Secondary | ICD-10-CM | POA: Diagnosis not present

## 2018-06-20 DIAGNOSIS — J029 Acute pharyngitis, unspecified: Secondary | ICD-10-CM | POA: Diagnosis not present

## 2018-07-11 DIAGNOSIS — Z13220 Encounter for screening for lipoid disorders: Secondary | ICD-10-CM | POA: Diagnosis not present

## 2018-07-11 DIAGNOSIS — Z0000 Encounter for general adult medical examination without abnormal findings: Secondary | ICD-10-CM | POA: Diagnosis not present

## 2018-12-05 DIAGNOSIS — Z01419 Encounter for gynecological examination (general) (routine) without abnormal findings: Secondary | ICD-10-CM | POA: Diagnosis not present

## 2018-12-05 DIAGNOSIS — Z1231 Encounter for screening mammogram for malignant neoplasm of breast: Secondary | ICD-10-CM | POA: Diagnosis not present

## 2018-12-05 DIAGNOSIS — Z130 Encounter for screening for diseases of the blood and blood-forming organs and certain disorders involving the immune mechanism: Secondary | ICD-10-CM | POA: Diagnosis not present

## 2018-12-21 DIAGNOSIS — E039 Hypothyroidism, unspecified: Secondary | ICD-10-CM | POA: Diagnosis not present

## 2018-12-29 DIAGNOSIS — E039 Hypothyroidism, unspecified: Secondary | ICD-10-CM | POA: Diagnosis not present

## 2021-12-30 ENCOUNTER — Other Ambulatory Visit: Payer: Self-pay | Admitting: Obstetrics and Gynecology

## 2021-12-30 DIAGNOSIS — R928 Other abnormal and inconclusive findings on diagnostic imaging of breast: Secondary | ICD-10-CM

## 2022-01-12 ENCOUNTER — Ambulatory Visit
Admission: RE | Admit: 2022-01-12 | Discharge: 2022-01-12 | Disposition: A | Discharge status: Home / Self Care | Payer: 59 | Source: Ambulatory Visit | Attending: Obstetrics and Gynecology | Admitting: Obstetrics and Gynecology

## 2022-01-12 ENCOUNTER — Other Ambulatory Visit: Payer: Self-pay | Admitting: Obstetrics and Gynecology

## 2022-01-12 DIAGNOSIS — R928 Other abnormal and inconclusive findings on diagnostic imaging of breast: Secondary | ICD-10-CM

## 2022-01-12 DIAGNOSIS — R922 Inconclusive mammogram: Secondary | ICD-10-CM | POA: Diagnosis not present

## 2022-01-16 ENCOUNTER — Other Ambulatory Visit: Payer: 59

## 2022-07-15 ENCOUNTER — Other Ambulatory Visit: Payer: Self-pay | Admitting: Obstetrics and Gynecology

## 2022-07-15 ENCOUNTER — Other Ambulatory Visit: Payer: 59

## 2022-07-15 ENCOUNTER — Ambulatory Visit
Admission: RE | Admit: 2022-07-15 | Discharge: 2022-07-15 | Disposition: A | Discharge status: Home / Self Care | Payer: 59 | Source: Ambulatory Visit | Attending: Obstetrics and Gynecology | Admitting: Obstetrics and Gynecology

## 2022-07-15 DIAGNOSIS — R928 Other abnormal and inconclusive findings on diagnostic imaging of breast: Secondary | ICD-10-CM

## 2022-07-15 DIAGNOSIS — N6310 Unspecified lump in the right breast, unspecified quadrant: Secondary | ICD-10-CM

## 2022-07-15 DIAGNOSIS — N6311 Unspecified lump in the right breast, upper outer quadrant: Secondary | ICD-10-CM | POA: Diagnosis not present

## 2022-11-09 ENCOUNTER — Ambulatory Visit
Admission: EM | Admit: 2022-11-09 | Discharge: 2022-11-09 | Disposition: A | Discharge status: Home / Self Care | Payer: 59 | Attending: Emergency Medicine | Admitting: Emergency Medicine

## 2022-11-09 DIAGNOSIS — R000 Tachycardia, unspecified: Secondary | ICD-10-CM | POA: Diagnosis not present

## 2022-11-09 DIAGNOSIS — R002 Palpitations: Secondary | ICD-10-CM

## 2022-11-09 NOTE — Discharge Instructions (Addendum)
Contact Dr. Lynnda Child office to be seen as soon as possible for your palpitations. She may want you to see a cardiologist for care.   Keep trying to wean off of caffeine.   You will get a call if tests are abnormal, you will not get a call if tests are normal but you can check results in MyChart if you have a MyChart account.

## 2022-11-09 NOTE — ED Triage Notes (Signed)
Pt presents to uc with co of tachycardia at night for one week per her fitbit. Pt is hypothyroid and is concerned she may need a change in medications. Pt also reports she has had a head cold for 3 days as well. Pt reports no otc medications. At home neg for covid. Pt reports she has been anxious but not in pain. She can feel her heart rate go up and it causes more anxiety

## 2022-11-10 LAB — TSH: TSH: 1.98 u[IU]/mL (ref 0.450–4.500)

## 2022-11-17 NOTE — ED Provider Notes (Signed)
[facility]    CSN: [id number] Arrival date & time: 11/09/22  1056      History   Chief Complaint Chief Complaint  Patient presents with   Tachycardia    HPI Sharon Lloyd is a 47 y.o. female. Pt presents to uc with co of tachycardia intermittently for one week per her fitbit and per the sensation of palpitations. Pt is hypothyroid and is concerned she may need a change in medications but is not able to get in to see her PCP promptly. Pt also reports she has had a head cold for 3 days as well. Pt reports no otc medications. At home neg for covid. Pt reports she has been anxious but not more than usual; she is not in pain. She can feel her heart rate go up and it causes more anxiety.   Denies syncope, dizziness, chest pain, SOB.   HPI  Past Medical History:  Diagnosis Date   Headache(784.0)    Hypothyroidism     Patient Active Problem List   Diagnosis Date Noted   Status post repeat low transverse cesarean section 09/04/2011   HYPOTHYROIDISM 11/05/2007   LACTOSE INTOLERANCE 11/05/2007   IRREGULAR HEART RATE 11/05/2007   DIARRHEA 11/05/2007    Past Surgical History:  Procedure Laterality Date   BREAST BIOPSY Right    pt states in 2006 and benign   CESAREAN SECTION  09/01/2011   Procedure: CESAREAN SECTION;  Surgeon: Melina Schools, MD;  Location: [facility];  Service: Gynecology;  Laterality: N/A;   DILATION AND CURETTAGE OF UTERUS     POLYPECTOMY      OB History     Gravida  2   Para  1   Term  1   Preterm      AB  1   Living  1      SAB      IAB      Ectopic      Multiple      Live Births  1            Home Medications    Prior to Admission medications   Medication Sig Start Date End Date Taking? Authorizing Provider  acetaminophen (TYLENOL) 325 MG tablet Take 650 mg by mouth every 6 (six) hours as needed for mild pain, moderate pain, fever or headache.    [provider]  chlorpheniramine-HYDROcodone (TUSSIONEX  PENNKINETIC ER) 10-8 MG/5ML LQCR Take 5 mLs by mouth 2 (two) times daily. 11/02/13   [provider]  levothyroxine (LEVOXYL) 137 MCG tablet Take 137 mcg by mouth daily.      [provider]  norethindrone-ethinyl estradiol (TRIPHASIL,CYCLAFEM,ALYACEN) 0.5/0.75/1-35 MG-MCG tablet Take 1 tablet by mouth daily.    [provider]  oseltamivir (TAMIFLU) 75 MG capsule Take 75 mg by mouth 2 (two) times daily.    [provider]    Family History History reviewed. No pertinent family history.  Social History Social History   Tobacco Use   Smoking status: Never  Substance Use Topics   Alcohol use: No   Drug use: No     Allergies   Azithromycin   Review of Systems Review of Systems   Physical Exam Triage Vital Signs ED Triage Vitals  Enc Vitals Group     BP 11/09/22 1110 (!) 143/103     Pulse Rate 11/09/22 1110 (!) 112     Resp 11/09/22 1110 19     Temp 11/09/22 1110 98.3 F (36.8  C)     Temp src --      SpO2 11/09/22 1110 98 %     Weight --      Height --      Head Circumference --      Peak Flow --      Pain Score 11/09/22 1108 0     Pain Loc --      Pain Edu? --      Excl. in [place]? --    No data found.  Updated Vital Signs BP (!) 143/103   Pulse (!) 112   Temp 98.3 F (36.8 C)   Resp 19   SpO2 98%   Visual Acuity Right Eye Distance:   Left Eye Distance:   Bilateral Distance:    Right Eye Near:   Left Eye Near:    Bilateral Near:     Physical Exam Constitutional:      General: She is not in acute distress.    Appearance: Normal appearance. She is not ill-appearing.  Cardiovascular:     Rate and Rhythm: Normal rate.     Heart sounds: No murmur heard.    Comments: When auscultating heart sounds, I can hear occasional extra beat such as from [facility] or PVC. No carotid bruit Pulmonary:     Effort: Pulmonary effort is normal.     Breath sounds: Normal breath sounds.  Musculoskeletal:     Right lower leg: No edema.      Left lower leg: No edema.  Neurological:     Mental Status: She is alert.      UC Treatments / Results  Labs (all labs ordered are listed, but only abnormal results are displayed) Labs Reviewed  TSH   Narrative:    Performed at:  [address], [place], [place]  [id number] Lab Director: Rush Farmer MD, Phone:  [phone]    EKG EKG: normal EKG, normal sinus rhythm. HR is 98 when pt at rest, supine.   Radiology No results found.  Procedures Procedures (including critical care time)  Medications Ordered in UC Medications - No data to display  Initial Impression / Assessment and Plan / UC Course  I have reviewed the triage vital signs and the nursing notes.  Pertinent labs & imaging results that were available during my care of the patient were reviewed by me and considered in my medical decision making (see chart for details).    Pt is worried about her thyroid levels and is not able to get into her PCP for testing promptly. TSH obtained and it is normal.  EKG unremarkable, HR high for young healthy patient at rest at 98 bpm. No ectopic beats on EKG but I hear some during exam of heart sounds. Pt to f/u with PCP, let her pcp know about ectopic beats. REviewed reasons for seeking emergency care.   Final Clinical Impressions(s) / UC Diagnoses   Final diagnoses:  Tachycardia  Palpitations     Discharge Instructions      Contact Dr. Lynnda Child office to be seen as soon as possible for your palpitations. She may want you to see a cardiologist for care.   Keep trying to wean off of caffeine.   You will get a call if tests are abnormal, you will not get a call if tests are normal but you can check results in MyChart if you have a MyChart account.     ED Prescriptions   None    PDMP not reviewed  this encounter.   Carvel Getting, NP 11/17/22 1109

## 2022-12-03 ENCOUNTER — Ambulatory Visit: Payer: 59 | Admitting: Internal Medicine

## 2022-12-03 ENCOUNTER — Encounter: Payer: Self-pay | Admitting: Internal Medicine

## 2022-12-03 VITALS — BP 140/92 | HR 102 | Ht 62.0 in | Wt 113.0 lb

## 2022-12-03 DIAGNOSIS — R030 Elevated blood-pressure reading, without diagnosis of hypertension: Secondary | ICD-10-CM

## 2022-12-03 DIAGNOSIS — R9431 Abnormal electrocardiogram [ECG] [EKG]: Secondary | ICD-10-CM

## 2022-12-03 DIAGNOSIS — R002 Palpitations: Secondary | ICD-10-CM

## 2022-12-03 DIAGNOSIS — I493 Ventricular premature depolarization: Secondary | ICD-10-CM

## 2022-12-03 MED ORDER — PROPRANOLOL HCL 10 MG PO TABS: 10.0000 mg | ORAL_TABLET | Freq: Three times a day (TID) | ORAL | 6 refills | Status: DC | PRN

## 2022-12-03 NOTE — Progress Notes (Signed)
Primary Physician/Referring:  Shirline Frees, MD  Patient ID: Sharon Lloyd, female    DOB: [birth year], 47 y.o.   MRN: [id number]  Chief Complaint  Patient presents with   Palpitations   New Patient (Initial Visit)   Insomnia   HPI:    Sharon Lloyd  is a 47 y.o. female with past medical history significant for PVCs and hypothyroid who is here to establish care with cardiology due to irregular heart rates and palpitations. intermittent tachycardia for a week.  Her Fitbit has recorded heart rates into the 130s and she states that her palpitations are worse when she tries to lay down and go to sleep.  She is on Synthroid for hypothyroidism and she recently had her thyroid level checked which was in normal range.  She notes a history of PVCs while she was pregnant about a decade ago but they have not occurred again until now.  Patient has insomnia and she usually takes Benadryl to go to sleep but she stopped taking this because she read that it can cause palpitations.  Now she has not slept all month and she feels awful.  Patient denies chest pain, shortness of breath, diaphoresis, syncope, edema, orthopnea, PND, claudication.  She works as a [employer] manager and she is currently very stressed out so this could be contributing to her palpitations.    Past Medical History:  Diagnosis Date   Headache(784.0)    Hypothyroidism    Past Surgical History:  Procedure Laterality Date   BREAST BIOPSY Right    pt states in 2006 and benign   CESAREAN SECTION  09/01/2011   Procedure: CESAREAN SECTION;  Surgeon: Melina Schools, MD;  Location: [facility];  Service: Gynecology;  Laterality: N/A;   DILATION AND CURETTAGE OF UTERUS     POLYPECTOMY     No family history on file.  Social History   Tobacco Use   Smoking status: Never   Smokeless tobacco: Not on file  Substance Use Topics   Alcohol use: No   Marital Status: Married  ROS  Review of Systems  Cardiovascular:  Positive for irregular  heartbeat and palpitations.   Objective  Blood pressure (!) 140/92, pulse (!) 102, height '5\' 2"'$  (1.575 m), weight 113 lb (51.3 kg), SpO2 99 %. Body mass index is 20.67 kg/m.     12/03/2022   10:40 AM 12/03/2022   10:29 AM 11/09/2022   11:10 AM  Vitals with BMI  Height  '5\' 2"'$    Weight  113 lbs   BMI  [id number]   Systolic [id number]  Diastolic 92 96 [id number]  Pulse 102 66 112    Physical Exam Vitals reviewed.  HENT:     Head: Normocephalic and atraumatic.  Cardiovascular:     Rate and Rhythm: Normal rate and regular rhythm.     Pulses: Normal pulses.     Heart sounds: Normal heart sounds. No murmur heard. Pulmonary:     Effort: Pulmonary effort is normal.     Breath sounds: Normal breath sounds.  Abdominal:     General: Bowel sounds are normal.  Musculoskeletal:     Right lower leg: No edema.     Left lower leg: No edema.  Skin:    General: Skin is warm and dry.  Neurological:     Mental Status: She is alert.     Medications and allergies   Allergies  Allergen Reactions   Azithromycin Other (See Comments)    GI  upset.     Medication list after today's encounter   Current Outpatient Medications:    drospirenone-ethinyl estradiol (YAZ) 3-0.02 MG tablet, Take 1 tablet by mouth daily., Disp: , Rfl:    propranolol (INDERAL) 10 MG tablet, Take 1 tablet (10 mg total) by mouth 3 (three) times daily as needed., Disp: 90 tablet, Rfl: 6   SYNTHROID 125 MCG tablet, Take 125 mcg by mouth daily before breakfast., Disp: , Rfl:   Laboratory examination:   No results found for: "NA", "K", "CO2", "GLUCOSE", "BUN", "CREATININE", "CALCIUM", "EGFR", "GFRNONAA"      No data to display            Latest Ref Rng & Units 09/02/2011    5:25 AM 08/28/2011    1:49 PM 06/20/2010   12:27 PM  CBC  WBC 4.0 - 10.5 K/uL 11.1  11.0    Hemoglobin 12.0 - 15.0 g/dL 11.1  12.9  15.4   Hematocrit 36.0 - 46.0 % 32.8  36.7    Platelets 150 - 400 K/uL 211  230      Lipid Panel No results for  input(s): "CHOL", "TRIG", "[place]", "VLDL", "HDL", "CHOLHDL", "LDLDIRECT" in the last 8760 hours.  HEMOGLOBIN A1C No results found for: "HGBA1C", "MPG" TSH Recent Labs    11/09/22 1223  TSH 1.980    External labs:     Radiology:    Cardiac Studies:     EKG:   12/03/2022: Sinus Rhythm with PVCs. LVH with ST changes secondary to hypertrophy.  Assessment     ICD-10-CM   1. Palpitations  R00.2 EKG 12-Lead    [facility] ECHOCARDIOGRAM COMPLETE    LONG TERM MONITOR (3-14 DAYS)    2. Nonspecific abnormal electrocardiogram (ECG) (EKG)  R94.31 [facility] ECHOCARDIOGRAM COMPLETE    LONG TERM MONITOR (3-14 DAYS)    3. PVC's (premature ventricular contractions)  I49.3 [facility]V ECHOCARDIOGRAM COMPLETE    LONG TERM MONITOR (3-14 DAYS)    4. Elevated blood pressure reading in office without diagnosis of hypertension  R03.0 [facility]V ECHOCARDIOGRAM COMPLETE    LONG TERM MONITOR (3-14 DAYS)       Orders Placed This Encounter  Procedures   LONG TERM MONITOR (3-14 DAYS)    Standing Status:   Future    Standing Expiration Date:   12/04/2023    Order Specific Question:   Where should this test be performed?    Answer:   [facility]    Order Specific Question:   Does the patient have an implanted cardiac device?    Answer:   No    Order Specific Question:   Prescribed days of wear    Answer:   64    Order Specific Question:   Type of enrollment    Answer:   Clinic Enrollment    Order Specific Question:   Release to patient    Answer:   Immediate   EKG 12-Lead   [facility] ECHOCARDIOGRAM COMPLETE    Standing Status:   Future    Standing Expiration Date:   12/04/2023    Meds ordered this encounter  Medications   propranolol (INDERAL) 10 MG tablet    Sig: Take 1 tablet (10 mg total) by mouth 3 (three) times daily as needed.    Dispense:  90 tablet    Refill:  6    Medications Discontinued During This Encounter  Medication Reason   norethindrone-ethinyl estradiol (TRIPHASIL,CYCLAFEM,ALYACEN)  0.5/0.75/1-35 MG-MCG tablet Completed Course   oseltamivir (TAMIFLU) 75 MG capsule Completed Course  levothyroxine (LEVOXYL) 137 MCG tablet Completed Course   acetaminophen (TYLENOL) 325 MG tablet Completed Course   chlorpheniramine-HYDROcodone (TUSSIONEX PENNKINETIC ER) 10-8 MG/5ML LQCR Completed Course     Recommendations:   GINAMARIE ALLBEE is a 47 y.o.  female with palpitations   Nonspecific abnormal electrocardiogram (ECG) (EKG) Echocardiogram ordered   PVC's (premature ventricular contractions) Palpitations Will obtain 2 week event monitor Propranolol 10 mg TID PRN sent to pharmacy   Elevated blood pressure reading in office without diagnosis of hypertension Patient states her BP is usually low Will hold off on starting anti-hypertensive     Floydene Flock, DO, [facility]  12/04/2022, 10:10 AM Office: [phone] Pager: [phone]

## 2022-12-07 ENCOUNTER — Other Ambulatory Visit: Payer: 59

## 2022-12-11 ENCOUNTER — Ambulatory Visit: Payer: 59

## 2022-12-11 DIAGNOSIS — R030 Elevated blood-pressure reading, without diagnosis of hypertension: Secondary | ICD-10-CM

## 2022-12-11 DIAGNOSIS — I493 Ventricular premature depolarization: Secondary | ICD-10-CM

## 2022-12-11 DIAGNOSIS — R002 Palpitations: Secondary | ICD-10-CM

## 2022-12-11 DIAGNOSIS — R9431 Abnormal electrocardiogram [ECG] [EKG]: Secondary | ICD-10-CM

## 2022-12-13 NOTE — Progress Notes (Signed)
normal

## 2022-12-15 NOTE — Progress Notes (Signed)
Gave patient results, she acknowledged understanding and had no further questions.

## 2023-01-05 NOTE — Progress Notes (Signed)
normal

## 2023-01-05 NOTE — Progress Notes (Signed)
Called patient to inform her about her monitor results. Patient understood

## 2023-01-11 ENCOUNTER — Other Ambulatory Visit: Payer: 59

## 2023-01-12 ENCOUNTER — Ambulatory Visit: Payer: 59 | Admitting: Internal Medicine

## 2023-01-13 ENCOUNTER — Ambulatory Visit: Payer: 59 | Admitting: Internal Medicine

## 2023-01-21 ENCOUNTER — Ambulatory Visit
Admission: RE | Admit: 2023-01-21 | Discharge: 2023-01-21 | Disposition: A | Discharge status: Home / Self Care | Payer: 59 | Source: Ambulatory Visit | Attending: Obstetrics and Gynecology | Admitting: Obstetrics and Gynecology

## 2023-01-21 DIAGNOSIS — N6310 Unspecified lump in the right breast, unspecified quadrant: Secondary | ICD-10-CM

## 2023-01-26 ENCOUNTER — Ambulatory Visit: Payer: 59 | Admitting: Internal Medicine

## 2023-02-04 ENCOUNTER — Ambulatory Visit: Payer: 59 | Admitting: Internal Medicine

## 2023-02-04 ENCOUNTER — Encounter: Payer: Self-pay | Admitting: Internal Medicine

## 2023-02-04 VITALS — BP 125/87 | HR 77 | Resp 16 | Ht 62.0 in | Wt 113.0 lb

## 2023-02-04 DIAGNOSIS — I493 Ventricular premature depolarization: Secondary | ICD-10-CM

## 2023-02-04 DIAGNOSIS — R002 Palpitations: Secondary | ICD-10-CM

## 2023-02-04 DIAGNOSIS — R9431 Abnormal electrocardiogram [ECG] [EKG]: Secondary | ICD-10-CM

## 2023-02-04 NOTE — Progress Notes (Signed)
Primary Physician/Referring:  Johny Blamer, MD  Patient ID: Sharon Lloyd, female    DOB: 09/26/1976, 47 y.o.   MRN: [id number]  Chief Complaint  Patient presents with   Palpitations   Follow-up   Results   HPI:    Sharon Lloyd  is a 47 y.o. female with past medical history significant for PVCs and hypothyroid who is here for a follow-up visit. She has been doing well since I last saw her. Her palpitations have basically resolved and she is taking propranolol PRN when she does feel them. No complaints or concerns today. She will follow up on as needed basis. Patient denies chest pain, shortness of breath, diaphoresis, syncope, edema, orthopnea, PND, claudication.      Past Medical History:  Diagnosis Date   Headache(784.0)    Hypothyroidism    Past Surgical History:  Procedure Laterality Date   BREAST BIOPSY Right    pt states in 2006 and benign   CESAREAN SECTION  09/01/2011   Procedure: CESAREAN SECTION;  Surgeon: Bing Plume, MD;  Location: [facility];  Service: Gynecology;  Laterality: N/A;   DILATION AND CURETTAGE OF UTERUS     POLYPECTOMY     History reviewed. No pertinent family history.  Social History   Tobacco Use   Smoking status: Never   Smokeless tobacco: Not on file  Substance Use Topics   Alcohol use: No   Marital Status: Married  ROS  Review of Systems  Cardiovascular:  Negative for irregular heartbeat and palpitations.   Objective  Blood pressure 125/87, pulse 77, resp. rate 16, height  (1.575 m), weight 113 lb (51.3 kg), SpO2 92 %. Body mass index is 20.67 kg/m.     02/04/2023    9:22 AM 12/03/2022   10:40 AM 12/03/2022   10:29 AM  Vitals with BMI  Height     Weight 113 lbs  113 lbs  BMI 20.66  20.66  Systolic 125 140 409  Diastolic 87 92 96  Pulse 77 102 66    Physical Exam Vitals reviewed.  HENT:     Head: Normocephalic and atraumatic.  Cardiovascular:     Rate and Rhythm: Normal rate and regular rhythm.     Pulses:  Normal pulses.     Heart sounds: Normal heart sounds. No murmur heard. Pulmonary:     Effort: Pulmonary effort is normal.     Breath sounds: Normal breath sounds.  Abdominal:     General: Bowel sounds are normal.  Musculoskeletal:     Right lower leg: No edema.     Left lower leg: No edema.  Skin:    General: Skin is warm and dry.  Neurological:     Mental Status: She is alert.     Medications and allergies   Allergies  Allergen Reactions   Azithromycin Other (See Comments)    GI upset.     Medication list after today's encounter   Current Outpatient Medications:    drospirenone-ethinyl estradiol (YAZ) 3-0.02 MG tablet, Take 1 tablet by mouth daily., Disp: , Rfl:    propranolol (INDERAL) 10 MG tablet, Take 1 tablet (10 mg total) by mouth 3 (three) times daily as needed., Disp: 90 tablet, Rfl: 6   SYNTHROID 125 MCG tablet, Take 125 mcg by mouth daily before breakfast., Disp: , Rfl:    diphenhydrAMINE (BENADRYL ALLERGY) 25 MG tablet, Take 25 mg by mouth at bedtime as needed., Disp: , Rfl:    traZODone (  DESYREL) 50 MG tablet, TAKE 1 TABLET BY MOUTH EVERY DAY AT BEDTIME AS NEEDED FOR 30 DAYS (Patient not taking: Reported on 02/04/2023), Disp: , Rfl:   Laboratory examination:   No results found for: "NA", "K", "CO2", "GLUCOSE", "BUN", "CREATININE", "CALCIUM", "EGFR", "GFRNONAA"      No data to display            Latest Ref Rng & Units 09/02/2011    5:25 AM 08/28/2011    1:49 PM 06/20/2010   12:27 PM  CBC  WBC 4.0 - 10.5 K/uL 11.1  11.0    Hemoglobin 12.0 - 15.0 g/dL 52.8  41.3  24.4   Hematocrit 36.0 - 46.0 % 32.8  36.7    Platelets 150 - 400 K/uL 211  230      Lipid Panel No results for input(s): "CHOL", "TRIG", "LDLCALC", "VLDL", "HDL", "CHOLHDL", "LDLDIRECT" in the last 8760 hours.  HEMOGLOBIN A1C No results found for: "HGBA1C", "MPG" TSH Recent Labs    11/09/22 1223  TSH 1.980    External labs:     Radiology:    Cardiac Studies:   Zio Patch  Extended Out Patient EKG monitoring 13 days starting 12/11/2022:   Dominant rhythm sinus rhythm. HR 56-168 bpm. Avg HR 86 bpm. 2 episodes of SVT, fastest and longest at 118 bpm for 6 beats. 0 episodes of VT Isolated SVE <1.0%, Couplet SVE <1.0%, Triplet SVE 0 Isolated VE 7.4%, 124370, Couplet VE <1.0%, Triplet VE <1.0% Ventricular Bigeminy and Trigeminy were present.  No atrial fibrillation/atrial flutter/SVT/VT/high grade AV block, sinus pause >3sec noted.   Echocardiogram 12/11/2022:  ECG: One lead EKG notes normal sinus with PVCs.  Normal LV systolic function with visual EF 55-60%. Left ventricle cavity  is normal in size. Normal left ventricular wall thickness. Normal global  wall motion. Normal diastolic filling pattern, normal LAP.  Left atrial cavity is normal in size. 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  Mild (Grade I) mitral regurgitation.  No prior study for comparison.    EKG:   12/03/2022: Sinus Rhythm with PVCs. LVH with ST changes secondary to hypertrophy.  Assessment     ICD-10-CM   1. Palpitations  R00.2     2. PVC's (premature ventricular contractions)  I49.3     3. Nonspecific abnormal electrocardiogram (ECG) (EKG)  R94.31        No orders of the defined types were placed in this encounter.   No orders of the defined types were placed in this encounter.   There are no discontinued medications.    Recommendations:   Sharon Lloyd is a 47 y.o.  female with palpitations   Nonspecific abnormal electrocardiogram (ECG) (EKG) PVC's (premature ventricular contractions) Palpitations Normal 2 week event monitor No ischemia or VHD on echocardiogram Discussed results with patent, all questions answered Propranolol 10 mg TID PRN sent to pharmacy She has an appointment with endocrinology today at 11:30 Her thyroid panel recently came back normal Propranolol PRN is working for her  palpitations She will follow-up PRN     Clotilde Dieter, DO, [facility]  02/04/2023, 9:40 AM Office: [phone] Pager: [phone]

## 2023-08-06 ENCOUNTER — Ambulatory Visit: Payer: 59 | Admitting: Cardiology

## 2023-10-20 NOTE — Progress Notes (Signed)
 Cardiology Office Note:  .   Date:  10/21/2023  ID:  Sharon Lloyd, DOB 1976/09/21, MRN [id number] PCP: Arloa Elsie JONELLE, MD  [facility] Providers Cardiologist:  None { History of Present Illness: .   Sharon Lloyd is a 48 y.o. female with a past medical history significant for PVCs and hypothyroidism who is here for follow-up appointment.  Was seen back in the spring and was doing well at that time.  Palpitations basically resolved and taking propranolol  as needed when she does feel them.  No complaints or concerns that day.  She denied chest pain, shortness of breath, diaphoresis, syncope, edema, orthopnea, PND, claudication.  Today, she presents with a  history of hypothyroidism with palpitations that are particularly noticeable at night when lying down. The patient reports that these palpitations are not bothersome during the day and do not interfere with her work. The patient also mentions experiencing heartburn recently, particularly after consuming acidic foods such as tomato soup and pizza. The heartburn is described as a crushing, burning sensation that is relieved with Tums. The patient has noticed this heartburn about five times, with the first occurrence after receiving the first COVID-19 vaccine and another occurrence after contracting COVID-19. The patient also reports that she has been experiencing this heartburn more frequently, about once a month.  Reports no shortness of breath nor dyspnea on exertion. Reports no chest pain, pressure, or tightness. No edema, orthopnea, PND. Reports no palpitations.   Discussed the use of AI scribe software for clinical note transcription with the patient, who gave verbal consent to proceed.  ROS: pertinent ROS in HPI  Studies Reviewed: .        Zio Patch Extended Out Patient EKG monitoring 13 days starting 12/11/2022:   Dominant rhythm sinus rhythm. HR 56-168 bpm. Avg HR 86 bpm. 2 episodes of SVT, fastest and longest at 118 bpm for 6  beats. 0 episodes of VT Isolated SVE <1.0%, Couplet SVE <1.0%, Triplet SVE 0 Isolated VE 7.4%, 124370, Couplet VE <1.0%, Triplet VE <1.0% Ventricular Bigeminy and Trigeminy were present.  No atrial fibrillation/atrial flutter/SVT/VT/high grade AV block, sinus pause >3sec noted.     Echocardiogram 12/11/2022:  ECG: One lead EKG notes normal sinus with PVCs.  Normal LV systolic function with visual EF 55-60%. Left ventricle cavity  is normal in size. Normal left ventricular wall thickness. Normal global  wall motion. Normal diastolic filling pattern, normal LAP.  Left atrial cavity is normal in size. 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  Mild (Grade I) mitral regurgitation.  No prior study for comparison.       Physical Exam:   VS:  BP 112/76 (BP Location: Left Arm, Patient Position: Sitting, Cuff Size: Normal)   Pulse 91   Resp 18   Ht 5' 2 (1.575 m)   Wt 113 lb 3.2 oz (51.3 kg)   SpO2 99%   BMI 20.70 kg/m    Wt Readings from Last 3 Encounters:  10/21/23 113 lb 3.2 oz (51.3 kg)  02/04/23 113 lb (51.3 kg)  12/03/22 113 lb (51.3 kg)    GEN: Well nourished, well developed in no acute distress NECK: No JVD; No carotid bruits CARDIAC: RRR with occasional PVC, no murmurs, rubs, gallops RESPIRATORY:  Clear to auscultation without rales, wheezing or rhonchi  ABDOMEN: Soft, non-tender, non-distended EXTREMITIES:  No edema; No deformity   ASSESSMENT AND PLAN: .  Premature Ventricular Contractions (PVCs) Noted on EKG and monitor with 7% ectopy. No dangerous arrhythmias or significant pauses. Symptoms mainly at night, not bothersome during the day. Currently on as-needed Propranolol . -Consider nightly Propranolol  if symptoms increase or become bothersome. -Continue monitoring symptoms and report any changes.  Gastroesophageal Reflux Disease (GERD) New onset of heartburn, particularly at night and after  consumption of acidic foods. Relief with Tums. -Consider over-the-counter Prilosec before bed if symptoms persist or worsen. -Avoid eating close to bedtime and limit consumption of trigger foods.  Hypothyroidism On Levothyroxine  125mcg daily. Last TSH 1.98 about a year ago. -Plan for repeat TSH in March 2025.  General Health Maintenance -Encouraged to improve hydration, particularly given work schedule. -Continue current sleep regimen, but consider alternatives to Benadryl .     Dispo: She wants to establish in [place], referred to Dr. Mady  Signed, Orren LOISE Fabry, PA-C

## 2023-10-21 ENCOUNTER — Ambulatory Visit: Payer: 59 | Attending: Physician Assistant | Admitting: Physician Assistant

## 2023-10-21 ENCOUNTER — Encounter: Payer: Self-pay | Admitting: Physician Assistant

## 2023-10-21 VITALS — BP 112/76 | HR 91 | Resp 18 | Ht 62.0 in | Wt 113.2 lb

## 2023-10-21 DIAGNOSIS — K219 Gastro-esophageal reflux disease without esophagitis: Secondary | ICD-10-CM | POA: Diagnosis not present

## 2023-10-21 DIAGNOSIS — E039 Hypothyroidism, unspecified: Secondary | ICD-10-CM

## 2023-10-21 DIAGNOSIS — R002 Palpitations: Secondary | ICD-10-CM | POA: Diagnosis not present

## 2023-10-21 DIAGNOSIS — I493 Ventricular premature depolarization: Secondary | ICD-10-CM | POA: Diagnosis not present

## 2023-10-21 MED ORDER — PROPRANOLOL HCL 10 MG PO TABS: 10.0000 mg | ORAL_TABLET | Freq: Three times a day (TID) | ORAL | 6 refills | Status: AC | PRN

## 2023-10-21 NOTE — Patient Instructions (Signed)
 Medication Instructions:  No changes  *If you need a refill on your cardiac medications before your next appointment, please call your pharmacy*   Lab Work: None If you have labs (blood work) drawn today and your tests are completely normal, you will receive your results only by: MyChart Message (if you have MyChart) OR A paper copy in the mail If you have any lab test that is abnormal or we need to change your treatment, we will call you to review the results.   Testing/Procedures: None    Follow-Up: At [facility], you and your health needs are our priority.  As part of our continuing mission to provide you with exceptional heart care, we have created designated Provider Care Teams.  These Care Teams include your primary Cardiologist (physician) and Advanced Practice Providers (APPs -  Physician Assistants and Nurse Practitioners) who all work together to provide you with the care you need, when you need it.    Your next appointment:   6 month(s)  Provider:   Lonni Hanson, MD

## 2023-11-08 ENCOUNTER — Encounter: Payer: Self-pay | Admitting: Obstetrics and Gynecology

## 2023-12-08 ENCOUNTER — Other Ambulatory Visit: Payer: Self-pay | Admitting: Obstetrics and Gynecology

## 2023-12-08 DIAGNOSIS — N63 Unspecified lump in unspecified breast: Secondary | ICD-10-CM

## 2024-01-24 ENCOUNTER — Ambulatory Visit
Admission: RE | Admit: 2024-01-24 | Discharge: 2024-01-24 | Disposition: A | Discharge status: Home / Self Care | Source: Ambulatory Visit | Attending: Obstetrics and Gynecology | Admitting: Obstetrics and Gynecology

## 2024-01-24 ENCOUNTER — Ambulatory Visit
Admission: RE | Admit: 2024-01-24 | Discharge: 2024-01-24 | Disposition: A | Discharge status: Home / Self Care | Payer: 59 | Source: Ambulatory Visit | Attending: Obstetrics and Gynecology | Admitting: Obstetrics and Gynecology

## 2024-01-24 ENCOUNTER — Other Ambulatory Visit: Payer: Self-pay | Admitting: Obstetrics and Gynecology

## 2024-01-24 DIAGNOSIS — N63 Unspecified lump in unspecified breast: Secondary | ICD-10-CM

## 2024-02-07 IMAGING — MG MM DIGITAL DIAGNOSTIC UNILAT*R* W/ TOMO W/ CAD
6 series · 6 of 18 positions shown · non-contrast
Comparison: Previous exam(s).
COMPARISON: Previous exam(s).

Addendum:
CLINICAL DATA: 45-year-old female recalled from screening mammogram
dated 12/23/2021 for a possible right breast mass. Patient has a
history of remote right breast biopsy for which the images have been
purged.

EXAM:
DIGITAL DIAGNOSTIC UNILATERAL RIGHT MAMMOGRAM WITH TOMOSYNTHESIS AND
CAD; ULTRASOUND RIGHT BREAST LIMITED
TECHNIQUE: Right digital diagnostic mammography and breast tomosynthesis was
performed. The images were evaluated with computer-aided detection.;
Targeted ultrasound examination of the right breast was performed

[R XCCL synth-2D]
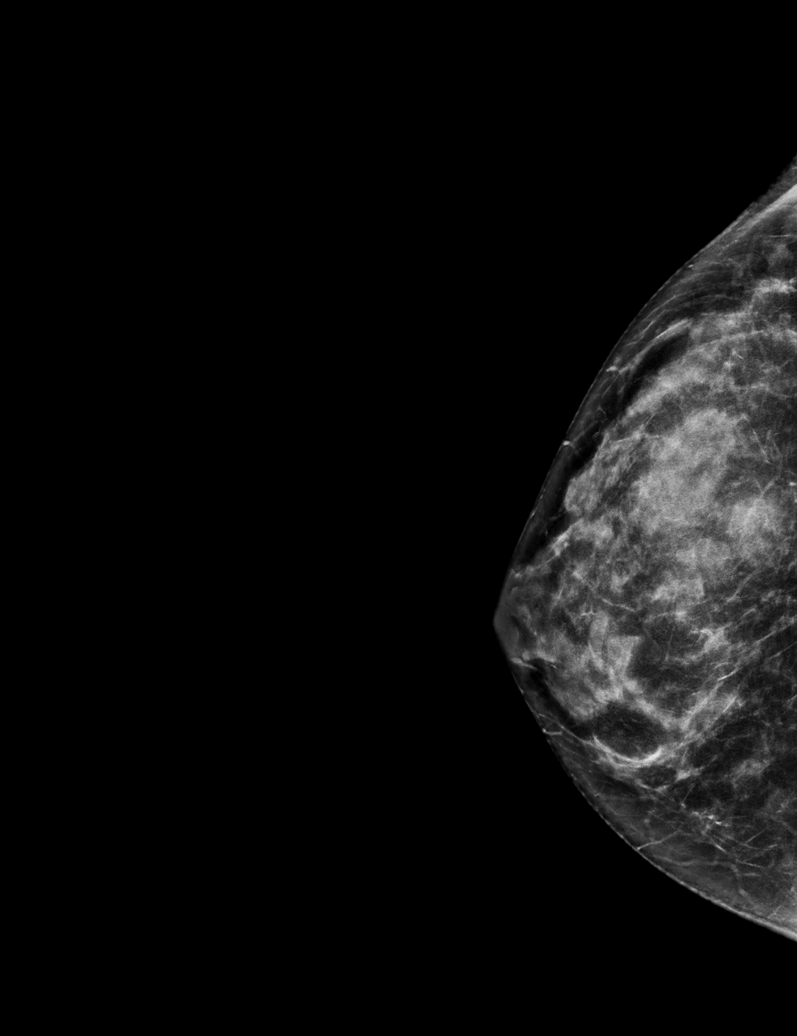

[R ML synth-2D]
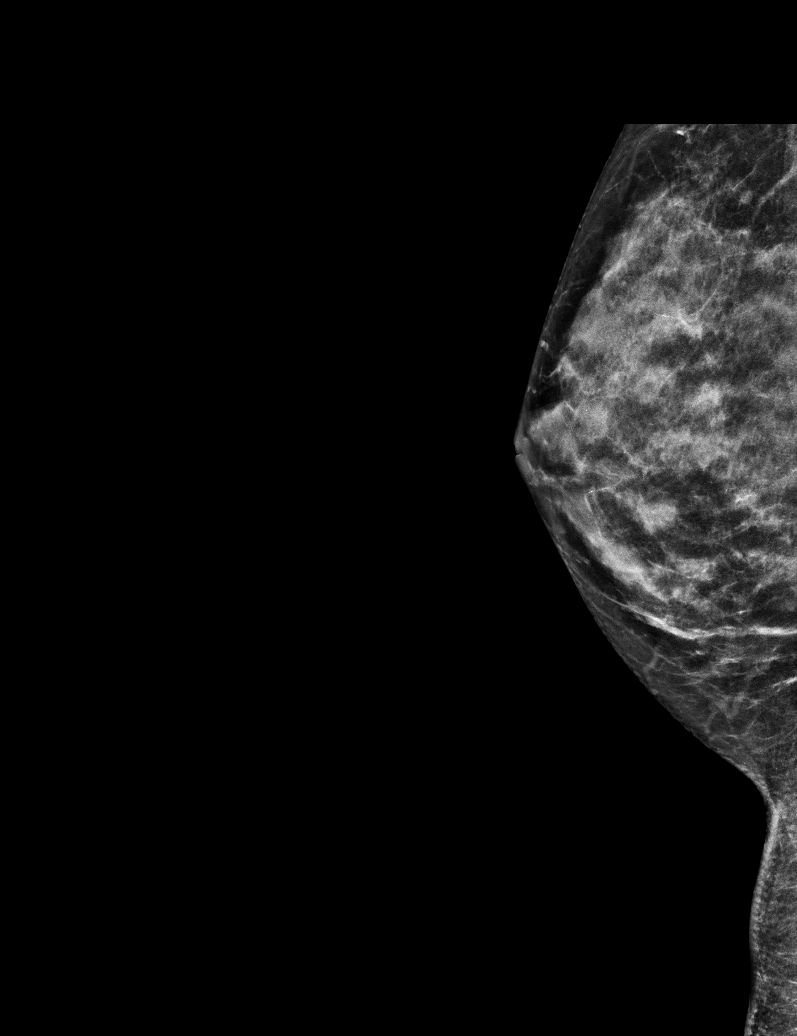

[R MLO synth-2D]
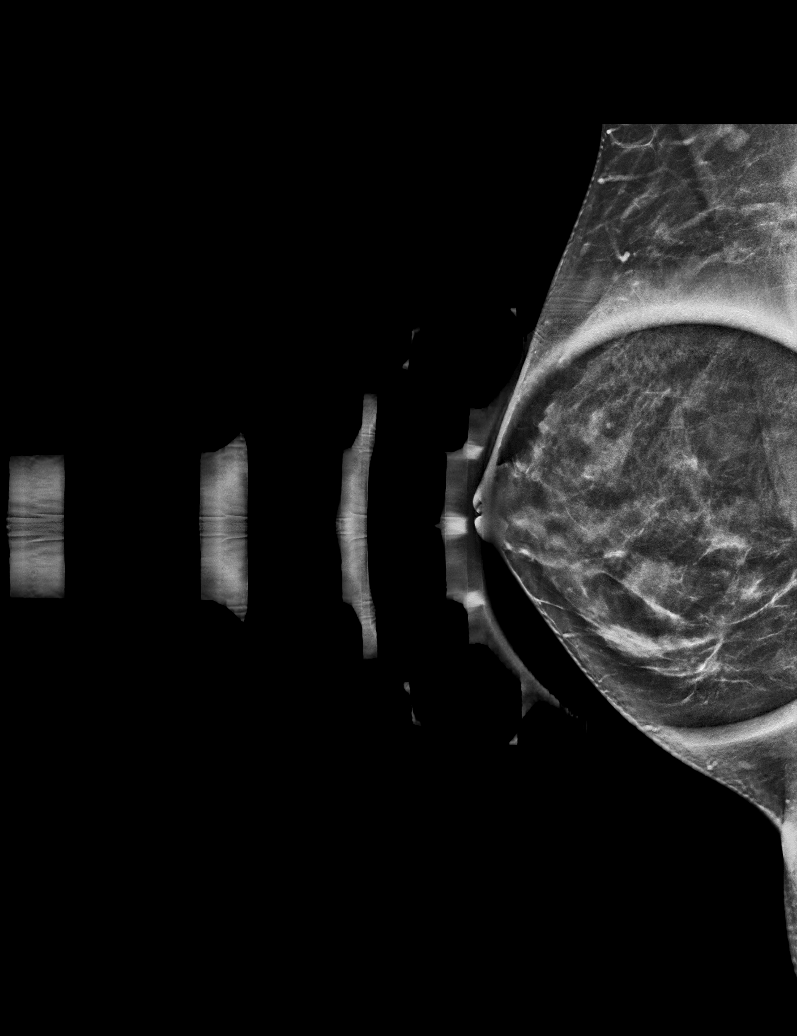

[R XCCL tomo · tomo slice 29/56.0]
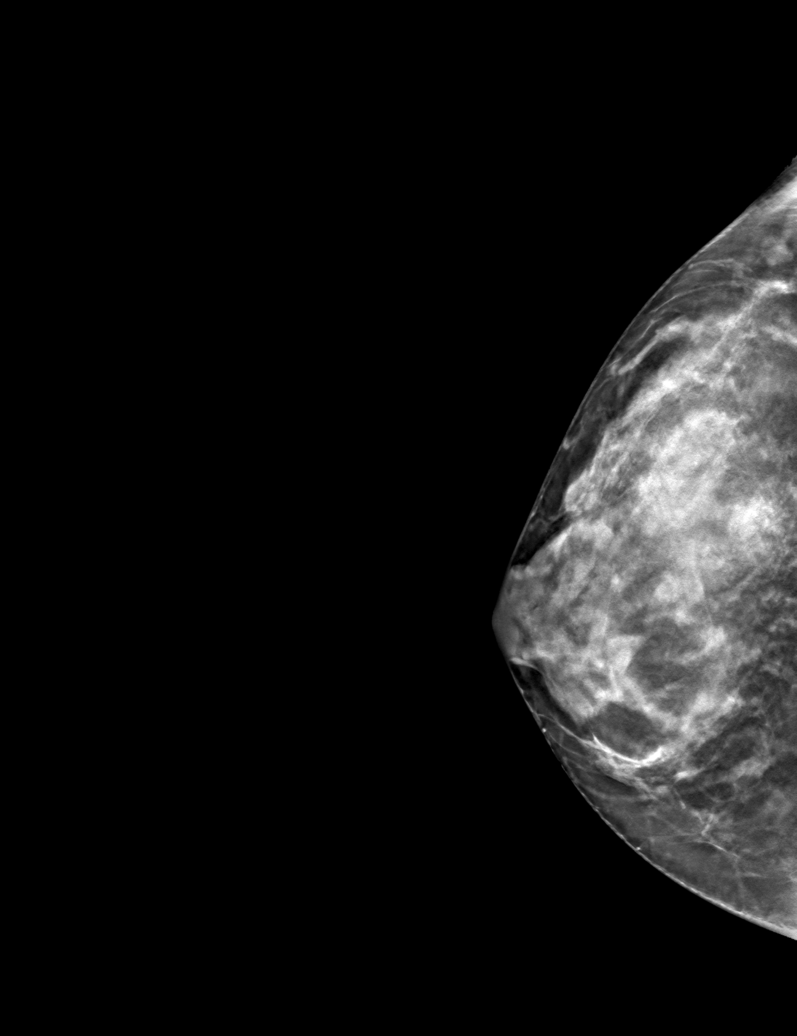

[R ML tomo · tomo slice 25/49.0]
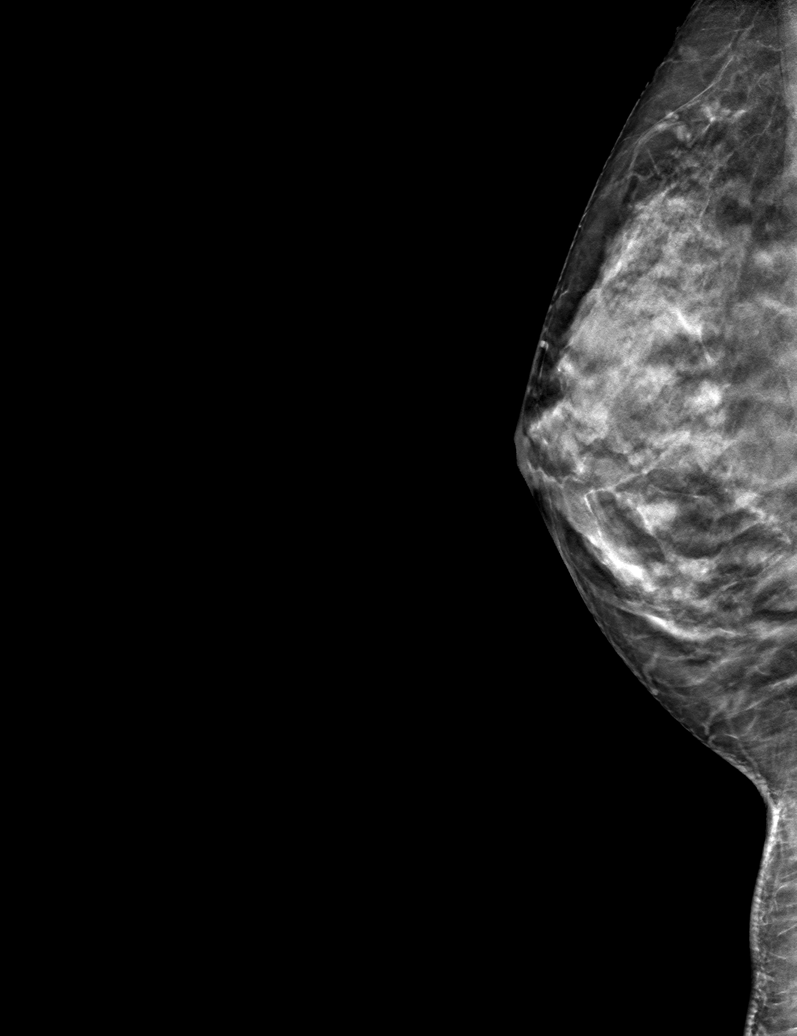

[R MLO tomo · tomo slice 29/57.0]
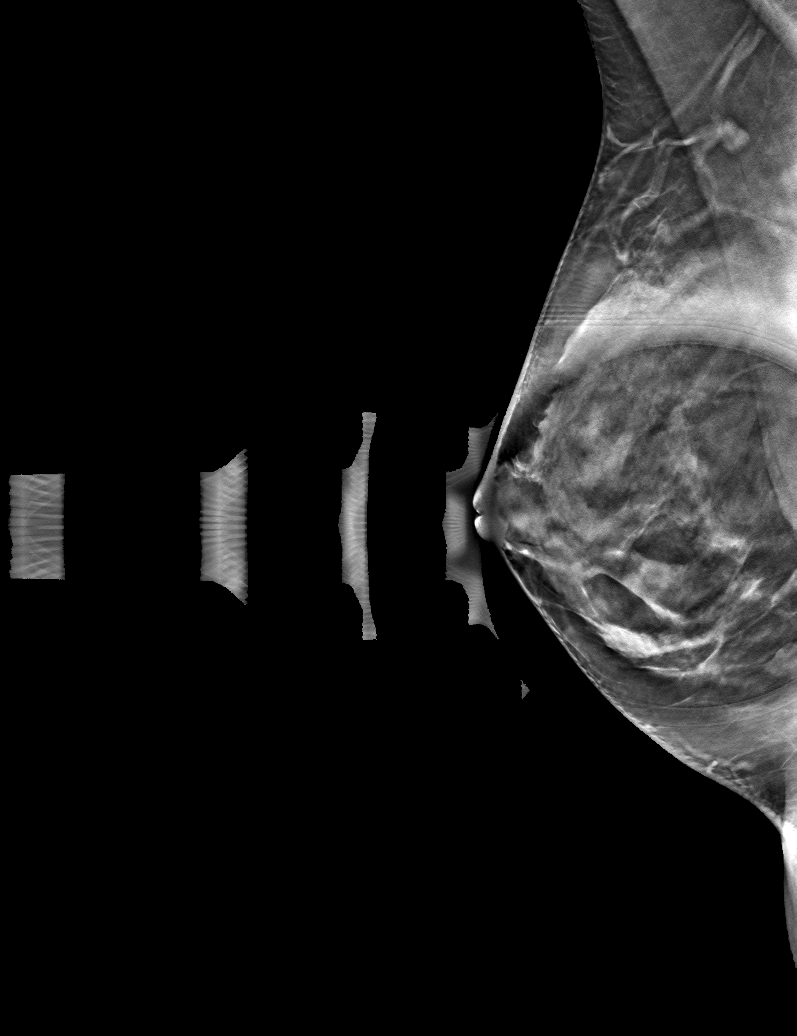

[6 of 18 positions shown; findings below may reference images not displayed]

ACR Breast Density Category d: The breast tissue is extremely dense,
which lowers the sensitivity of mammography.
FINDINGS: There is a persistent round, circumscribed equal density mass in the
deep lower outer right breast. Further evaluation with ultrasound
was performed.

Targeted ultrasound is performed, showing an oval, circumscribed
anechoic cyst at the 8 o'clock position 5 cm from the nipple. It
measures 1.7 x 0.8 x 1.4 cm. There is no internal vascularity. This
correlates well with the mammographic finding. Additional simple
anechoic cysts are noted along the 10 o'clock position 1 cm from the
nipple. There is an oval, circumscribed hypoechoic mass at the 10
o'clock position 1 cm from the nipple measuring 1.1 x 1.0 x 0.4 cm.
IMPRESSION: 1. Probably benign hypoechoic left breast mass at the 10 o'clock
position 1 cm from the nipple. Recommend short-term interval
follow-up.
2. Additional benign simple cysts throughout the lateral right
breast, the largest of which measures 1.7 cm at the 8 o'clock
position, corresponding with the screening mammographic findings.

RECOMMENDATION:
Right breast ultrasound in 6 months.

I have discussed the findings and recommendations with the patient.
If applicable, a reminder letter will be sent to the patient
regarding the next appointment.

BI-RADS CATEGORY  3: Probably benign.

ADDENDUM:
IMPRESSION: 1. Probably benign hypoechoic RIGHT breast mass at the 10 o'clock
position 1 cm from the nipple. Recommend short-term interval
follow-up.
2. Additional benign simple cysts throughout the lateral right
breast, the largest of which measures 1.7 cm at the 8 o'clock
position, corresponding with the screening mammographic findings.

*** End of Addendum ***
ACR Breast Density Category d: The breast tissue is extremely dense,
which lowers the sensitivity of mammography.
FINDINGS: There is a persistent round, circumscribed equal density mass in the
deep lower outer right breast. Further evaluation with ultrasound
was performed.

Targeted ultrasound is performed, showing an oval, circumscribed
anechoic cyst at the 8 o'clock position 5 cm from the nipple. It
measures 1.7 x 0.8 x 1.4 cm. There is no internal vascularity. This
correlates well with the mammographic finding. Additional simple
anechoic cysts are noted along the 10 o'clock position 1 cm from the
nipple. There is an oval, circumscribed hypoechoic mass at the 10
o'clock position 1 cm from the nipple measuring 1.1 x 1.0 x 0.4 cm.
IMPRESSION: 1. Probably benign hypoechoic left breast mass at the 10 o'clock
position 1 cm from the nipple. Recommend short-term interval
follow-up.
2. Additional benign simple cysts throughout the lateral right
breast, the largest of which measures 1.7 cm at the 8 o'clock
position, corresponding with the screening mammographic findings.

RECOMMENDATION:
Right breast ultrasound in 6 months.

I have discussed the findings and recommendations with the patient.
If applicable, a reminder letter will be sent to the patient
regarding the next appointment.

BI-RADS CATEGORY  3: Probably benign.

## 2024-02-07 IMAGING — US US BREAST*R* LIMITED INC AXILLA
1 series · 10 of 10 positions shown · non-contrast
Comparison: Previous exam(s).
COMPARISON: Previous exam(s).

Addendum:
CLINICAL DATA: 45-year-old female recalled from screening mammogram
dated 12/23/2021 for a possible right breast mass. Patient has a
history of remote right breast biopsy for which the images have been
purged.

EXAM:
DIGITAL DIAGNOSTIC UNILATERAL RIGHT MAMMOGRAM WITH TOMOSYNTHESIS AND
CAD; ULTRASOUND RIGHT BREAST LIMITED
TECHNIQUE: Right digital diagnostic mammography and breast tomosynthesis was
performed. The images were evaluated with computer-aided detection.;
Targeted ultrasound examination of the right breast was performed

[Series 1: us breast*right* limited inc axilla · 0.06mm/px · 10 of 10 slices shown]
[im 1/10]
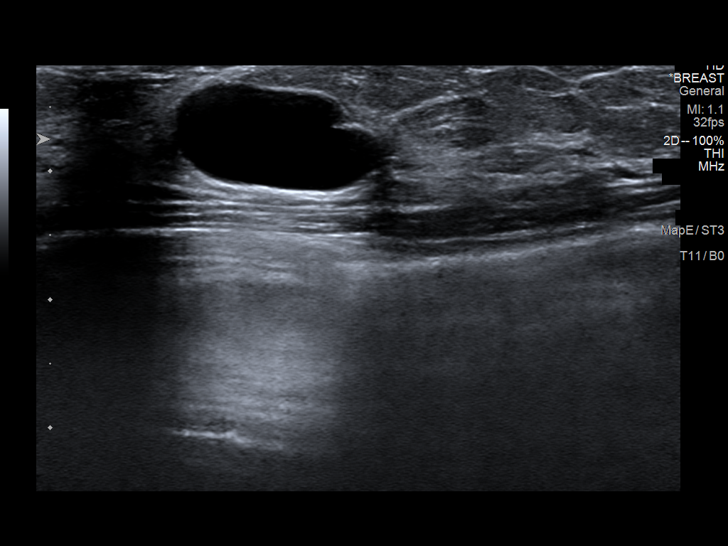
[im 2/10]
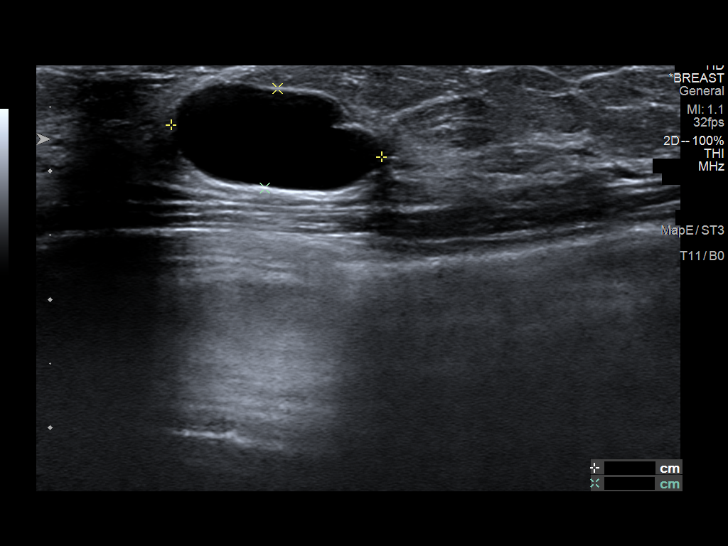
[im 3/10]
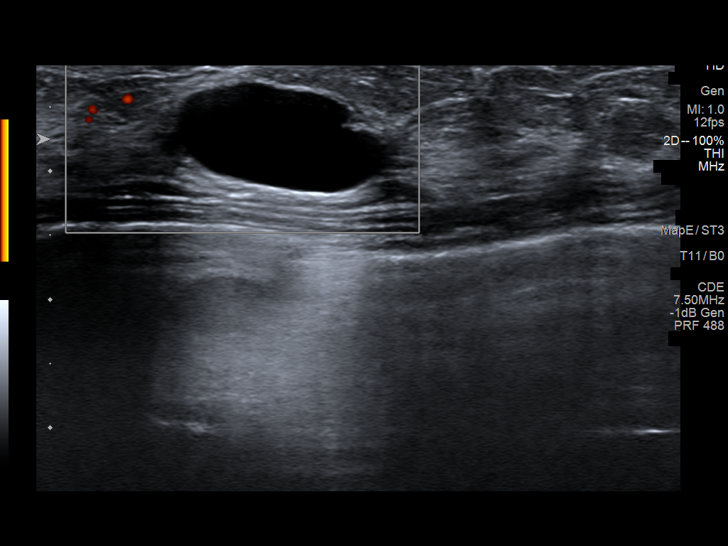
[im 4/10]
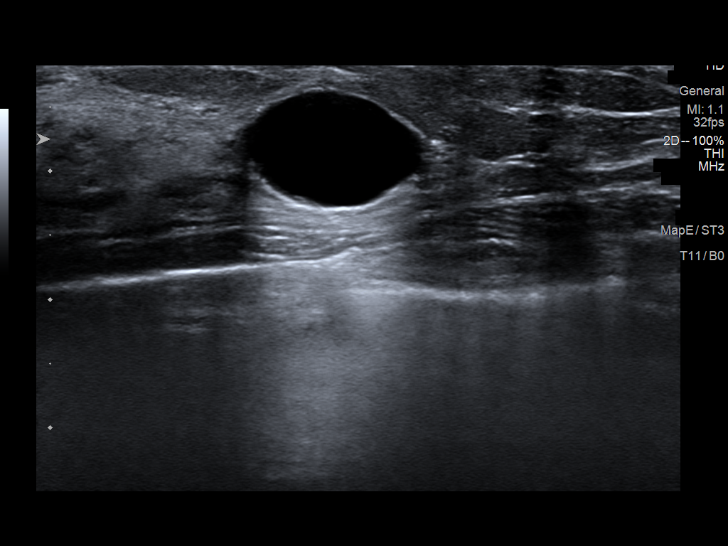
[im 5/10]
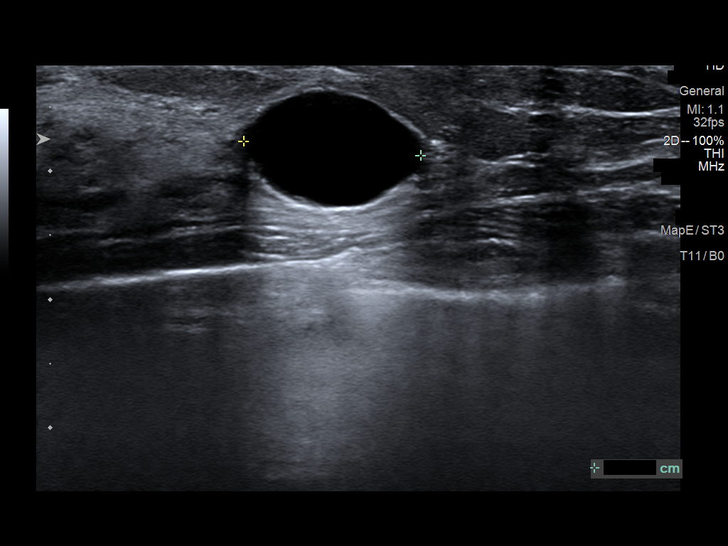
[im 6/10]
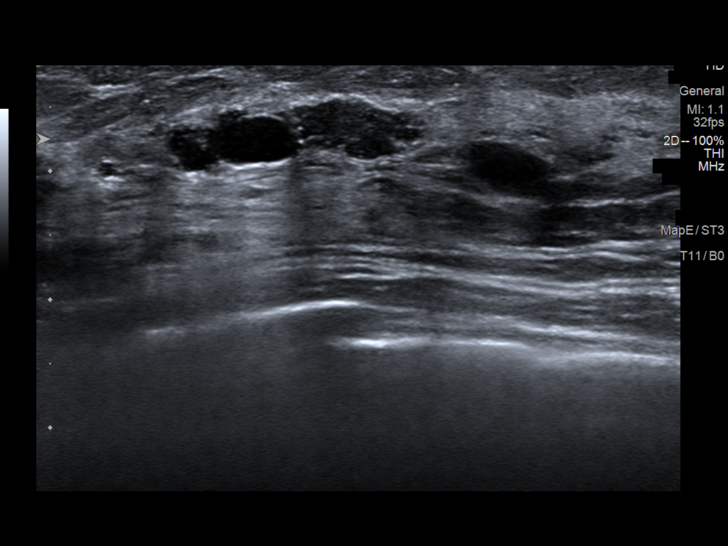
[im 7/10]
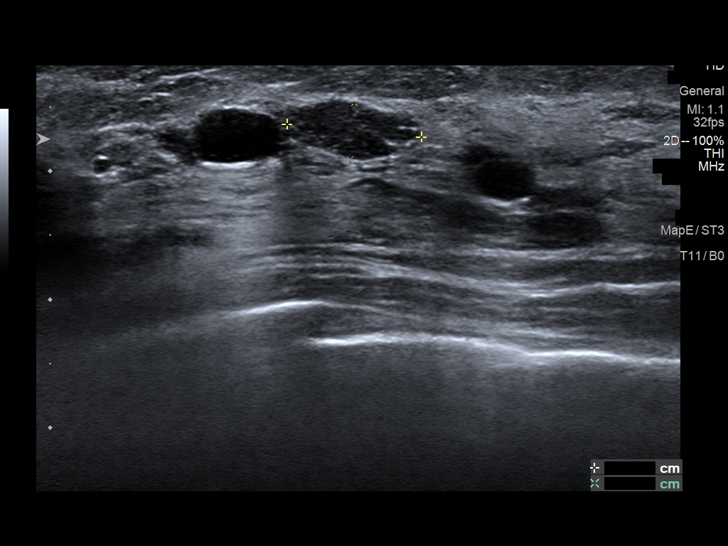
[im 8/10]
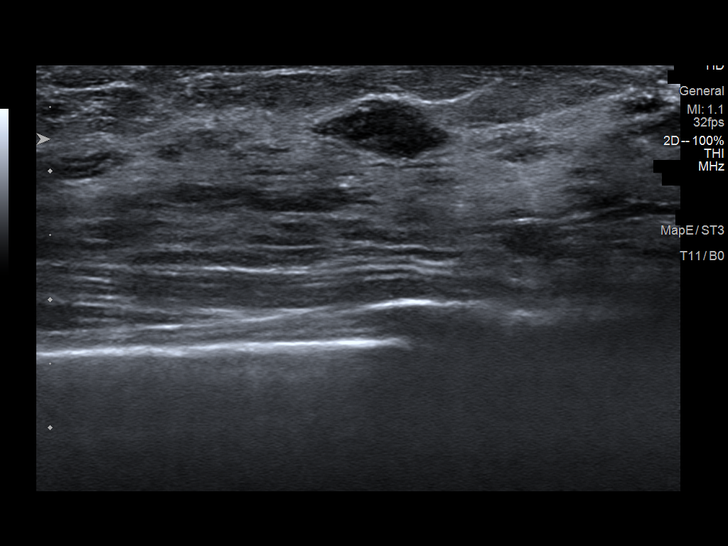
[im 9/10]
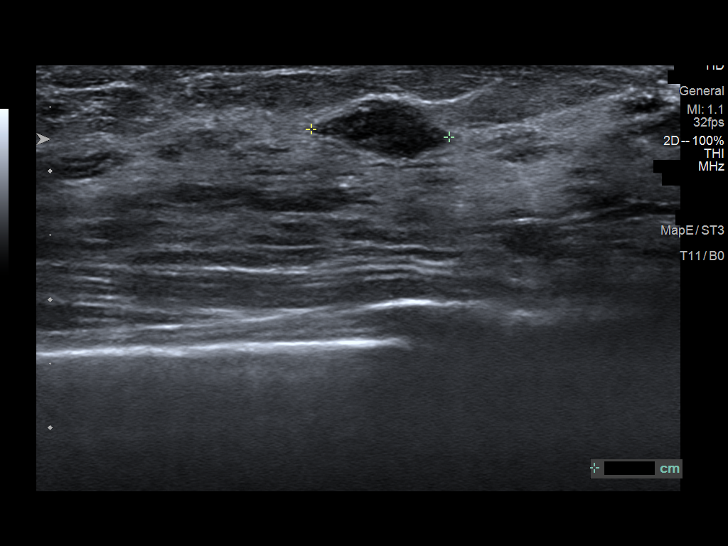
[im 10/10]
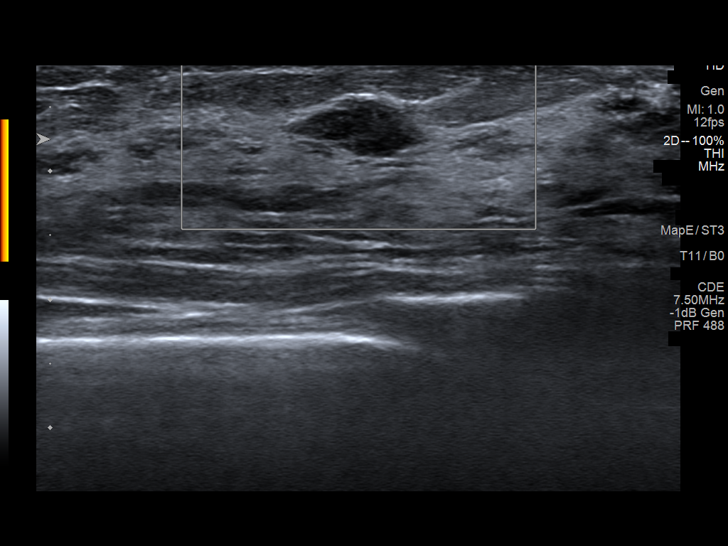

[10 of 10 positions shown; findings below may reference images not displayed]

ACR Breast Density Category d: The breast tissue is extremely dense,
which lowers the sensitivity of mammography.
FINDINGS: There is a persistent round, circumscribed equal density mass in the
deep lower outer right breast. Further evaluation with ultrasound
was performed.

Targeted ultrasound is performed, showing an oval, circumscribed
anechoic cyst at the 8 o'clock position 5 cm from the nipple. It
measures 1.7 x 0.8 x 1.4 cm. There is no internal vascularity. This
correlates well with the mammographic finding. Additional simple
anechoic cysts are noted along the 10 o'clock position 1 cm from the
nipple. There is an oval, circumscribed hypoechoic mass at the 10
o'clock position 1 cm from the nipple measuring 1.1 x 1.0 x 0.4 cm.
IMPRESSION: 1. Probably benign hypoechoic left breast mass at the 10 o'clock
position 1 cm from the nipple. Recommend short-term interval
follow-up.
2. Additional benign simple cysts throughout the lateral right
breast, the largest of which measures 1.7 cm at the 8 o'clock
position, corresponding with the screening mammographic findings.

RECOMMENDATION:
Right breast ultrasound in 6 months.

I have discussed the findings and recommendations with the patient.
If applicable, a reminder letter will be sent to the patient
regarding the next appointment.

BI-RADS CATEGORY  3: Probably benign.

ADDENDUM:
IMPRESSION: 1. Probably benign hypoechoic RIGHT breast mass at the 10 o'clock
position 1 cm from the nipple. Recommend short-term interval
follow-up.
2. Additional benign simple cysts throughout the lateral right
breast, the largest of which measures 1.7 cm at the 8 o'clock
position, corresponding with the screening mammographic findings.

*** End of Addendum ***
ACR Breast Density Category d: The breast tissue is extremely dense,
which lowers the sensitivity of mammography.
FINDINGS: There is a persistent round, circumscribed equal density mass in the
deep lower outer right breast. Further evaluation with ultrasound
was performed.

Targeted ultrasound is performed, showing an oval, circumscribed
anechoic cyst at the 8 o'clock position 5 cm from the nipple. It
measures 1.7 x 0.8 x 1.4 cm. There is no internal vascularity. This
correlates well with the mammographic finding. Additional simple
anechoic cysts are noted along the 10 o'clock position 1 cm from the
nipple. There is an oval, circumscribed hypoechoic mass at the 10
o'clock position 1 cm from the nipple measuring 1.1 x 1.0 x 0.4 cm.
IMPRESSION: 1. Probably benign hypoechoic left breast mass at the 10 o'clock
position 1 cm from the nipple. Recommend short-term interval
follow-up.
2. Additional benign simple cysts throughout the lateral right
breast, the largest of which measures 1.7 cm at the 8 o'clock
position, corresponding with the screening mammographic findings.

RECOMMENDATION:
Right breast ultrasound in 6 months.

I have discussed the findings and recommendations with the patient.
If applicable, a reminder letter will be sent to the patient
regarding the next appointment.

BI-RADS CATEGORY  3: Probably benign.

## 2024-03-11 ENCOUNTER — Ambulatory Visit
Admission: EM | Admit: 2024-03-11 | Discharge: 2024-03-11 | Disposition: A | Discharge status: Home / Self Care | Attending: Emergency Medicine | Admitting: Emergency Medicine

## 2024-03-11 DIAGNOSIS — R051 Acute cough: Secondary | ICD-10-CM

## 2024-03-11 DIAGNOSIS — J029 Acute pharyngitis, unspecified: Secondary | ICD-10-CM

## 2024-03-11 DIAGNOSIS — J069 Acute upper respiratory infection, unspecified: Secondary | ICD-10-CM | POA: Diagnosis not present

## 2024-03-11 LAB — POCT RAPID STREP A (OFFICE): Rapid Strep A Screen: NEGATIVE

## 2024-03-11 MED ORDER — AMOXICILLIN 875 MG PO TABS: 875.0000 mg | ORAL_TABLET | Freq: Two times a day (BID) | ORAL | 0 refills | Status: AC

## 2024-03-11 MED ORDER — PROMETHAZINE-DM 6.25-15 MG/5ML PO SYRP: 5.0000 mL | ORAL_SOLUTION | Freq: Four times a day (QID) | ORAL | 0 refills | Status: DC | PRN

## 2024-03-11 NOTE — ED Triage Notes (Signed)
 Patient to Urgent Care with complaints of sore throat/"burning"/ cough/ temp yesterday 100.9.  Symptoms started Monday. Two negative Covid tests at home.  Taking tylenol  an ibuprofen .

## 2024-03-11 NOTE — ED Provider Notes (Signed)
 Sharon Lloyd    CSN: [id number] Arrival date & time: 03/11/24  1452      History   Chief Complaint Chief Complaint  Patient presents with   Sore Throat    HPI Sharon Lloyd is a 48 y.o. female.  Patient presents with 5-6 day history of fever, ear pain, sore throat, cough.  Tmax 100.9.  He has been treating her symptoms with Tylenol  and ibuprofen .  No shortness of breath, vomiting, diarrhea.  The history is provided by the patient and medical records.    Past Medical History:  Diagnosis Date   Headache(784.0)    Hypothyroidism     Patient Active Problem List   Diagnosis Date Noted   Status post repeat low transverse cesarean section 09/04/2011   Hypothyroidism 11/05/2007   LACTOSE INTOLERANCE 11/05/2007   Cardiac dysrhythmia 11/05/2007   DIARRHEA 11/05/2007    Past Surgical History:  Procedure Laterality Date   BREAST BIOPSY Right    pt states in 2006 and benign   CESAREAN SECTION  09/01/2011   Procedure: CESAREAN SECTION;  Surgeon: Romilda Coaster, MD;  Location: [facility];  Service: Gynecology;  Laterality: N/A;   DILATION AND CURETTAGE OF UTERUS     POLYPECTOMY      OB History     Gravida  2   Para  1   Term  1   Preterm      AB  1   Living  1      SAB      IAB      Ectopic      Multiple      Live Births  1            Home Medications    Prior to Admission medications   Medication Sig Start Date End Date Taking? Authorizing Provider  amoxicillin  (AMOXIL ) 875 MG tablet Take 1 tablet (875 mg total) by mouth 2 (two) times daily for 10 days. 03/11/24 03/21/24 Yes Wellington Half, NP  promethazine -dextromethorphan (PROMETHAZINE -DM) 6.25-15 MG/5ML syrup Take 5 mLs by mouth 4 (four) times daily as needed. 03/11/24  Yes Wellington Half, NP  diphenhydrAMINE  (BENADRYL  ALLERGY) 25 MG tablet Take 25 mg by mouth at bedtime as needed.    [provider]  drospirenone-ethinyl estradiol (YAZ) 3-0.02 MG tablet Take 1 tablet by mouth daily.     [provider]  propranolol  (INDERAL ) 10 MG tablet Take 1 tablet (10 mg total) by mouth 3 (three) times daily as needed. Patient not taking: Reported on 03/11/2024 10/21/23   Conte, Tessa N, PA-C  SYNTHROID  125 MCG tablet Take 125 mcg by mouth daily before breakfast. 05/26/18   [provider]  traZODone (DESYREL) 50 MG tablet  01/04/23   [provider]    Family History History reviewed. No pertinent family history.  Social History Social History   Tobacco Use   Smoking status: Never  Substance Use Topics   Alcohol use: No   Drug use: No     Allergies   Azithromycin   Review of Systems Review of Systems  Constitutional:  Positive for fever. Negative for chills.  HENT:  Positive for ear pain and sore throat.   Respiratory:  Positive for cough. Negative for shortness of breath.   Gastrointestinal:  Negative for diarrhea and vomiting.     Physical Exam Triage Vital Signs ED Triage Vitals  Encounter Vitals Group     BP      Systolic BP Percentile  Diastolic BP Percentile      Pulse      Resp      Temp      Temp src      SpO2      Weight      Height      Head Circumference      Peak Flow      Pain Score      Pain Loc      Pain Education      Exclude from Growth Chart    No data found.  Updated Vital Signs BP 132/89   Pulse 94   Temp 98.1 F (36.7 C)   Resp 18   SpO2 98%   Visual Acuity Right Eye Distance:   Left Eye Distance:   Bilateral Distance:    Right Eye Near:   Left Eye Near:    Bilateral Near:     Physical Exam Constitutional:      General: She is not in acute distress. HENT:     Right Ear: Tympanic membrane normal.     Left Ear: Tympanic membrane normal.     Nose: Nose normal.     Mouth/Throat:     Mouth: Mucous membranes are moist.     Pharynx: Posterior oropharyngeal erythema present.  Cardiovascular:     Rate and Rhythm: Normal rate and regular rhythm.     Heart sounds: Normal heart sounds.   Pulmonary:     Effort: Pulmonary effort is normal. No respiratory distress.     Breath sounds: Normal breath sounds.  Neurological:     Mental Status: She is alert.      UC Treatments / Results  Labs (all labs ordered are listed, but only abnormal results are displayed) Labs Reviewed  POCT RAPID STREP A (OFFICE)    EKG   Radiology No results found.  Procedures Procedures (including critical care time)  Medications Ordered in UC Medications - No data to display  Initial Impression / Assessment and Plan / UC Course  I have reviewed the triage vital signs and the nursing notes.  Pertinent labs & imaging results that were available during my care of the patient were reviewed by me and considered in my medical decision making (see chart for details).   Upper respiratory infection, pharyngitis, cough.  Patient has been symptomatic for 6 days.  Treating today with amoxicillin  and promethazine  DM.  Precautions for drowsiness with promethazine  discussed.  Education provided on upper respiratory infection.  Instructed patient to follow up with her PCP if her symptoms are not improving.  She agrees to plan of care.   Final Clinical Impressions(s) / UC Diagnoses   Final diagnoses:  Acute upper respiratory infection  Acute pharyngitis, unspecified etiology  Acute cough     Discharge Instructions      Take the amoxicillin  as directed.    Take the promethazine  DM as directed.  Do not drive, operate machinery, drink alcohol, or perform dangerous activities while taking this medication as it may cause drowsiness.  Follow-up with your primary care provider if your symptoms are not improving.    ED Prescriptions     Medication Sig Dispense Auth. Provider   amoxicillin  (AMOXIL ) 875 MG tablet Take 1 tablet (875 mg total) by mouth 2 (two) times daily for 10 days. 20 tablet Wellington Half, NP   promethazine -dextromethorphan (PROMETHAZINE -DM) 6.25-15 MG/5ML syrup Take 5 mLs by  mouth 4 (four) times daily as needed. 118 mL Wellington Half, NP  PDMP not reviewed this encounter.   Wellington Half, NP 03/11/24 [phone]

## 2024-03-11 NOTE — Discharge Instructions (Signed)
Take the amoxicillin as directed.    Take the promethazine DM as directed.  Do not drive, operate machinery, drink alcohol, or perform dangerous activities while taking this medication as it may cause drowsiness.  Follow up with your primary care provider if your symptoms are not improving.

## 2024-07-19 ENCOUNTER — Ambulatory Visit: Attending: Internal Medicine | Admitting: Internal Medicine

## 2024-07-19 ENCOUNTER — Encounter: Payer: Self-pay | Admitting: Internal Medicine

## 2024-07-19 VITALS — BP 130/72 | HR 84 | Ht 63.0 in | Wt 112.6 lb

## 2024-07-19 DIAGNOSIS — R002 Palpitations: Secondary | ICD-10-CM | POA: Insufficient documentation

## 2024-07-19 DIAGNOSIS — I493 Ventricular premature depolarization: Secondary | ICD-10-CM | POA: Insufficient documentation

## 2024-07-19 NOTE — Progress Notes (Signed)
  Cardiology Office Note:  .   Date:  07/19/2024  ID:  Sharon Lloyd, DOB 1975/11/08, MRN [id number] PCP: Arloa Elsie JONELLE, MD  [facility] Providers Cardiologist:  None     History of Present Illness: .   Sharon Lloyd is a 48 y.o. female with history of PVCs and hypothyroidism, who presents for follow-up of PVCs.  She was last seen in 10/2023 by Orren Fabry, PA, at which time she was feeling fairly well, complaining primarily of heartburn.  She denied chest pain, shortness of breath, and palpitations.  No further cardiac testing or medication changes were made.  Today, Sharon Lloyd reports that she has continued to feel well with only rare isolated palpitations, most noticeable when she is lying down.  She has not had any chest pain, shortness of breath, lightheadedness, or edema.  She has not needed to take her as needed propranolol  in over a year.  She drinks 1 to 2 cups of coffee per day and intermittently also has a diet Coke.  ROS: See HPI  Studies Reviewed: SABRA   EKG Interpretation Date/Time:  Wednesday July 19 2024 09:53:23 EDT Ventricular Rate:  84 PR Interval:  164 QRS Duration:  82 QT Interval:  374 QTC Calculation: 441 R Axis:   70  Text Interpretation: Normal sinus rhythm Normal ECG When compared with ECG of 21-Oct-2023 11:12, Premature ventricular complexes are no longer Present Confirmed by Macaiah Mangal, Lonni [phone]) on 07/19/2024 10:00:39 AM    Event monitor (12/31/2022): Predominantly sinus rhythm with rare PACs and frequent PVCs (7.4% burden).  2 supraventricular runs lasting up to 6 beats were noted.  TTE (12/11/2022): LVEF 55-60% with normal LV size and wall thickness.  Normal diastolic parameters.  Mild MR.  Risk Assessment/Calculations:             Physical Exam:   VS:  BP 130/72 (BP Location: Left Arm, Patient Position: Sitting, Cuff Size: Normal)   Pulse 84   Ht 5' 3 (1.6 m)   Wt 112 lb 9.6 oz (51.1 kg)   SpO2 99%   BMI 19.95 kg/m    Wt Readings from Last  3 Encounters:  07/19/24 112 lb 9.6 oz (51.1 kg)  10/21/23 113 lb 3.2 oz (51.3 kg)  02/04/23 113 lb (51.3 kg)    General:  NAD. Neck: No JVD or HJR. Lungs: Clear to auscultation bilaterally without wheezes or crackles. Heart: Regular rate and rhythm without murmurs, rubs, or gallops. Abdomen: Soft, nontender, nondistended. Extremities: No lower extremity edema.  ASSESSMENT AND PLAN: .    Palpitations and PVCs: Rare, self-limited palpitations reported without worrisome symptoms.  EKG today shows normal sinus rhythm without ectopy.  Previous monitor showed frequent PVCs with structurally normal heart by echo.  No further intervention recommended at this time.  Sharon Lloyd can continue to use as needed propranolol  if she has significant palpitations.    Dispo: Return to clinic as needed.  Signed, Lonni Hanson, MD

## 2024-07-19 NOTE — Patient Instructions (Signed)
 Medication Instructions:  Your physician recommends that you continue on your current medications as directed. Please refer to the Current Medication list given to you today.    *If you need a refill on your cardiac medications before your next appointment, please call your pharmacy*  Lab Work: No labs ordered today    Testing/Procedures: No test ordered today   Follow-Up: At [facility], you and your health needs are our priority.  As part of our continuing mission to provide you with exceptional heart care, our providers are all part of one team.  This team includes your primary Cardiologist (physician) and Advanced Practice Providers or APPs (Physician Assistants and Nurse Practitioners) who all work together to provide you with the care you need, when you need it.  Your next appointment:   As needed  Provider:   You may see Sammy Crisp, MD or one of the following Advanced Practice Providers on your designated Care Team:   Laneta Pintos, NP Gildardo Labrador, PA-C Varney Gentleman, PA-C Cadence Winger, PA-C Ronald Cockayne, NP Morey Ar, NP
